# Patient Record
Sex: Male | Born: 1985 | Race: White | Hispanic: No | Marital: Married | Smoking: Current every day smoker
Health system: Southern US, Community
[De-identification: ages and names within clinical notes are randomized; demographics above are authoritative.]

## PROBLEM LIST (undated history)

## (undated) ENCOUNTER — Emergency Department (HOSPITAL_COMMUNITY): Admission: EM | Payer: Self-pay | Source: Home / Self Care

## (undated) DIAGNOSIS — S42409A Unspecified fracture of lower end of unspecified humerus, initial encounter for closed fracture: Secondary | ICD-10-CM

## (undated) DIAGNOSIS — L0390 Cellulitis, unspecified: Secondary | ICD-10-CM

---
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
## 2007-09-02 ENCOUNTER — Other Ambulatory Visit: Payer: Self-pay

## 2007-09-02 ENCOUNTER — Emergency Department: Payer: Self-pay | Admitting: Emergency Medicine

## 2007-10-09 ENCOUNTER — Emergency Department: Payer: Self-pay | Admitting: Emergency Medicine

## 2007-11-01 ENCOUNTER — Emergency Department: Payer: Self-pay | Admitting: Unknown Physician Specialty

## 2008-07-19 ENCOUNTER — Inpatient Hospital Stay: Payer: Self-pay | Admitting: Internal Medicine

## 2008-07-20 ENCOUNTER — Inpatient Hospital Stay: Payer: Self-pay | Admitting: Psychiatry

## 2009-11-04 ENCOUNTER — Emergency Department (HOSPITAL_COMMUNITY): Admission: EM | Admit: 2009-11-04 | Discharge: 2009-11-04 | Payer: Self-pay | Admitting: Emergency Medicine

## 2010-06-10 ENCOUNTER — Emergency Department (HOSPITAL_COMMUNITY)
Admission: EM | Admit: 2010-06-10 | Discharge: 2010-06-10 | Payer: Self-pay | Source: Home / Self Care | Admitting: Emergency Medicine

## 2010-10-18 ENCOUNTER — Emergency Department (HOSPITAL_COMMUNITY)
Admission: EM | Admit: 2010-10-18 | Discharge: 2010-10-19 | Disposition: A | Discharge status: Home / Self Care | Payer: No Typology Code available for payment source | Attending: Emergency Medicine | Admitting: Emergency Medicine

## 2010-10-18 DIAGNOSIS — G8929 Other chronic pain: Secondary | ICD-10-CM | POA: Insufficient documentation

## 2010-10-18 DIAGNOSIS — M546 Pain in thoracic spine: Secondary | ICD-10-CM | POA: Insufficient documentation

## 2011-06-25 DIAGNOSIS — S42409A Unspecified fracture of lower end of unspecified humerus, initial encounter for closed fracture: Secondary | ICD-10-CM

## 2011-06-25 HISTORY — DX: Unspecified fracture of lower end of unspecified humerus, initial encounter for closed fracture: S42.409A

## 2013-07-05 ENCOUNTER — Encounter (HOSPITAL_COMMUNITY): Payer: Self-pay | Admitting: Emergency Medicine

## 2013-07-05 ENCOUNTER — Emergency Department (HOSPITAL_COMMUNITY)
Admission: EM | Admit: 2013-07-05 | Discharge: 2013-07-05 | Disposition: A | Discharge status: Home / Self Care | Payer: Self-pay | Attending: Emergency Medicine | Admitting: Emergency Medicine

## 2013-07-05 ENCOUNTER — Emergency Department (HOSPITAL_COMMUNITY): Payer: Self-pay

## 2013-07-05 DIAGNOSIS — M1990 Unspecified osteoarthritis, unspecified site: Secondary | ICD-10-CM

## 2013-07-05 DIAGNOSIS — X500XXA Overexertion from strenuous movement or load, initial encounter: Secondary | ICD-10-CM | POA: Insufficient documentation

## 2013-07-05 DIAGNOSIS — S6990XA Unspecified injury of unspecified wrist, hand and finger(s), initial encounter: Principal | ICD-10-CM | POA: Insufficient documentation

## 2013-07-05 DIAGNOSIS — M19029 Primary osteoarthritis, unspecified elbow: Secondary | ICD-10-CM | POA: Insufficient documentation

## 2013-07-05 DIAGNOSIS — Y9389 Activity, other specified: Secondary | ICD-10-CM | POA: Insufficient documentation

## 2013-07-05 DIAGNOSIS — M25522 Pain in left elbow: Secondary | ICD-10-CM

## 2013-07-05 DIAGNOSIS — Z8781 Personal history of (healed) traumatic fracture: Secondary | ICD-10-CM | POA: Insufficient documentation

## 2013-07-05 DIAGNOSIS — S59909A Unspecified injury of unspecified elbow, initial encounter: Secondary | ICD-10-CM | POA: Insufficient documentation

## 2013-07-05 DIAGNOSIS — S59919A Unspecified injury of unspecified forearm, initial encounter: Principal | ICD-10-CM

## 2013-07-05 DIAGNOSIS — G8921 Chronic pain due to trauma: Secondary | ICD-10-CM | POA: Insufficient documentation

## 2013-07-05 DIAGNOSIS — Y929 Unspecified place or not applicable: Secondary | ICD-10-CM | POA: Insufficient documentation

## 2013-07-05 HISTORY — DX: Unspecified fracture of lower end of unspecified humerus, initial encounter for closed fracture: S42.409A

## 2013-07-05 MED ORDER — NAPROXEN 500 MG PO TABS: 500.0000 mg | ORAL_TABLET | Freq: Two times a day (BID) | ORAL | Status: DC

## 2013-07-05 NOTE — Discharge Instructions (Signed)
Ice, take naproxen as directed. Avoid heavy lifting or hard physical activity for the next few days.  Osteoarthritis Osteoarthritis is a disease that causes soreness and swelling (inflammation) of a joint. It occurs when the cartilage at the affected joint wears down. Cartilage acts as a cushion, covering the ends of bones where they meet to form a joint. Osteoarthritis is the most common form of arthritis. It often occurs in older people. The joints affected most often by this condition include those in the:  Ends of the fingers.  Thumbs.  Neck.  Lower back.  Knees.  Hips. CAUSES  Over time, the cartilage that covers the ends of bones begins to wear away. This causes bone to rub on bone, producing pain and stiffness in the affected joints.  RISK FACTORS Certain factors can increase your chances of having osteoarthritis, including:  Older age.  Excessive body weight.  Overuse of joints. SIGNS AND SYMPTOMS   Pain, swelling, and stiffness in the joint.  Over time, the joint may lose its normal shape.  Small deposits of bone (osteophytes) may grow on the edges of the joint.  Bits of bone or cartilage can break off and float inside the joint space. This may cause more pain and damage. DIAGNOSIS  Your health care provider will do a physical exam and ask about your symptoms. Various tests may be ordered, such as:  X-rays of the affected joint.  An MRI scan.  Blood tests to rule out other types of arthritis.  Joint fluid tests. This involves using a needle to draw fluid from the joint and examining the fluid under a microscope. TREATMENT  Goals of treatment are to control pain and improve joint function. Treatment plans may include:  A prescribed exercise program that allows for rest and joint relief.  A weight control plan.  Pain relief techniques, such as:  Properly applied heat and cold.  Electric pulses delivered to nerve endings under the skin (transcutaneous  electrical nerve stimulation, TENS).  Massage.  Certain nutritional supplements.  Medicines to control pain, such as:  Acetaminophen.  Nonsteroidal anti-inflammatory drugs (NSAIDs), such as naproxen.  Narcotic or central-acting agents, such as tramadol.  Corticosteroids. These can be given orally or as an injection.  Surgery to reposition the bones and relieve pain (osteotomy) or to remove loose pieces of bone and cartilage. Joint replacement may be needed in advanced states of osteoarthritis. HOME CARE INSTRUCTIONS   Only take over-the-counter or prescription medicines as directed by your health care provider. Take all medicines exactly as instructed.  Maintain a healthy weight. Follow your health care provider's instructions for weight control. This may include dietary instructions.  Exercise as directed. Your health care provider can recommend specific types of exercise. These may include:  Strengthening exercises These are done to strengthen the muscles that support joints affected by arthritis. They can be performed with weights or with exercise bands to add resistance.  Aerobic activities These are exercises, such as brisk walking or low-impact aerobics, that get your heart pumping.  Range-of-motion activities These keep your joints limber.  Balance and agility exercises These help you maintain daily living skills.  Rest your affected joints as directed by your health care provider.  Follow up with your health care provider as directed. SEEK MEDICAL CARE IF:   Your skin turns red.  You develop a rash in addition to your joint pain.  You have worsening joint pain. SEEK IMMEDIATE MEDICAL CARE IF:  You have a significant loss  of weight or appetite.  You have a fever along with joint or muscle aches.  You have night sweats. FOR MORE INFORMATION  National Institute of Arthritis and Musculoskeletal and Skin Diseases: www.niams.http://www.myers.net/nih.gov [employer]:  https://walker.com/www.nia.nih.gov American College of Rheumatology: www.rheumatology.org Document Released: 06/10/2005 Document Revised: 03/31/2013 Document Reviewed: 02/15/2013 [facility] Patient Information 2014 [employer], [place].
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
## 2013-07-05 NOTE — ED Notes (Signed)
Pt comfortable with d/c and f/u instructions. Prescriptions x1. Discussed RICE method for relieving elbow pain. Pt ambulatory from room in NAD.
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
## 2013-07-08 NOTE — ED Provider Notes (Signed)
Medical screening examination/treatment/procedure(s) were performed by non-physician practitioner and as supervising physician I was immediately available for consultation/collaboration.  EKG Interpretation   None        Laura-Lee Villegas R. Traves Majchrzak, MD 07/08/13 1607 

## 2015-09-07 IMAGING — CR DG ELBOW COMPLETE 3+V*L*
4 series · 4 of 4 positions shown · non-contrast
Comparison: 01/12/2011.

CLINICAL DATA: Pain lateral left elbow.  Pain with swelling.

EXAM:
LEFT ELBOW - COMPLETE 3+ VIEW

[x elbow ap left]
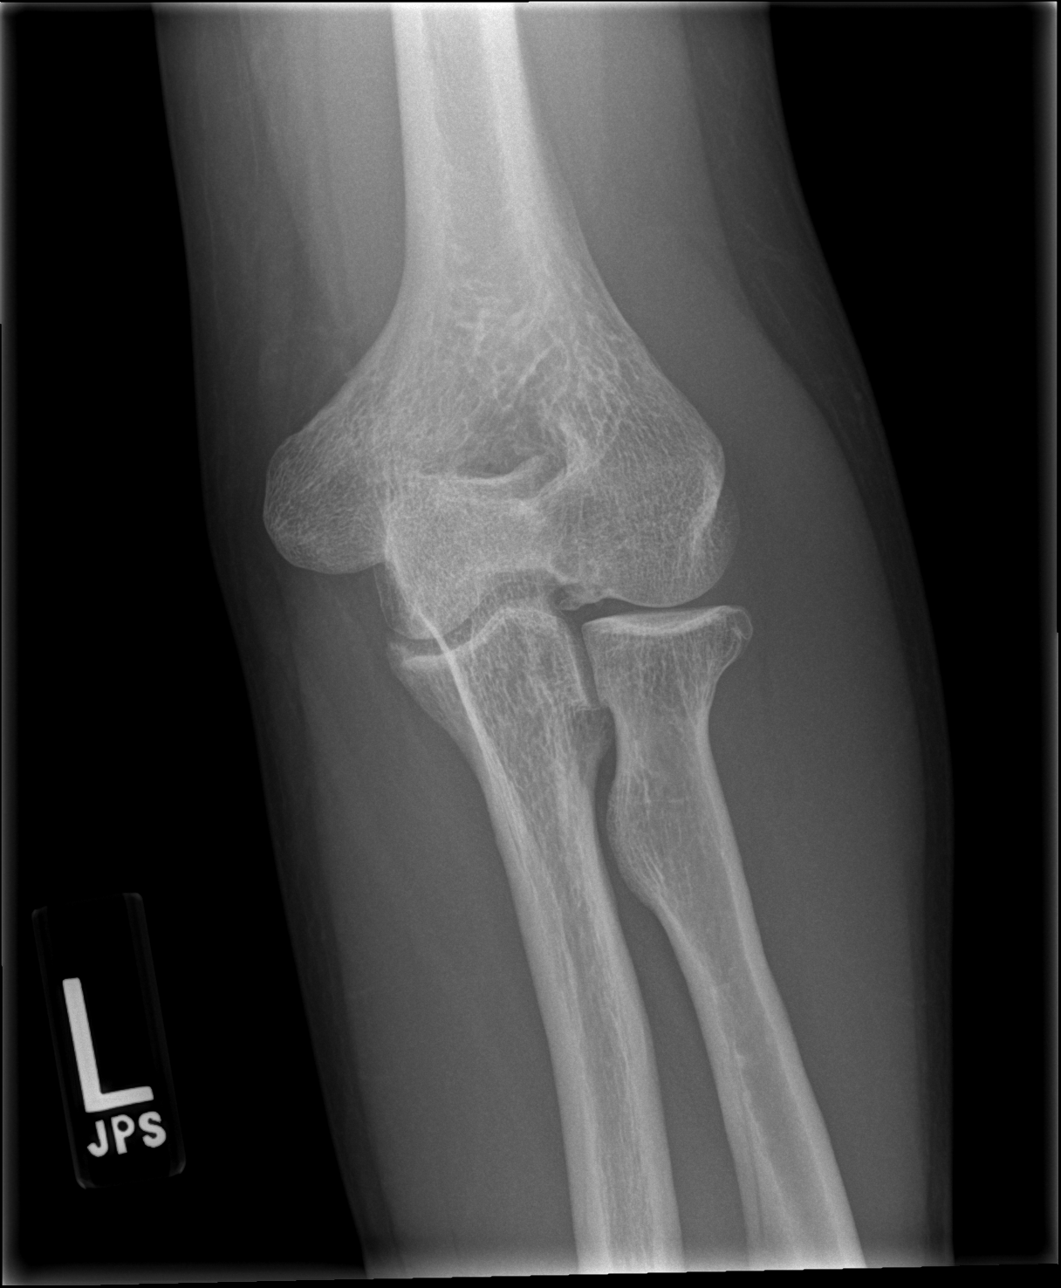

[x elbow obl left (1 of 2)]
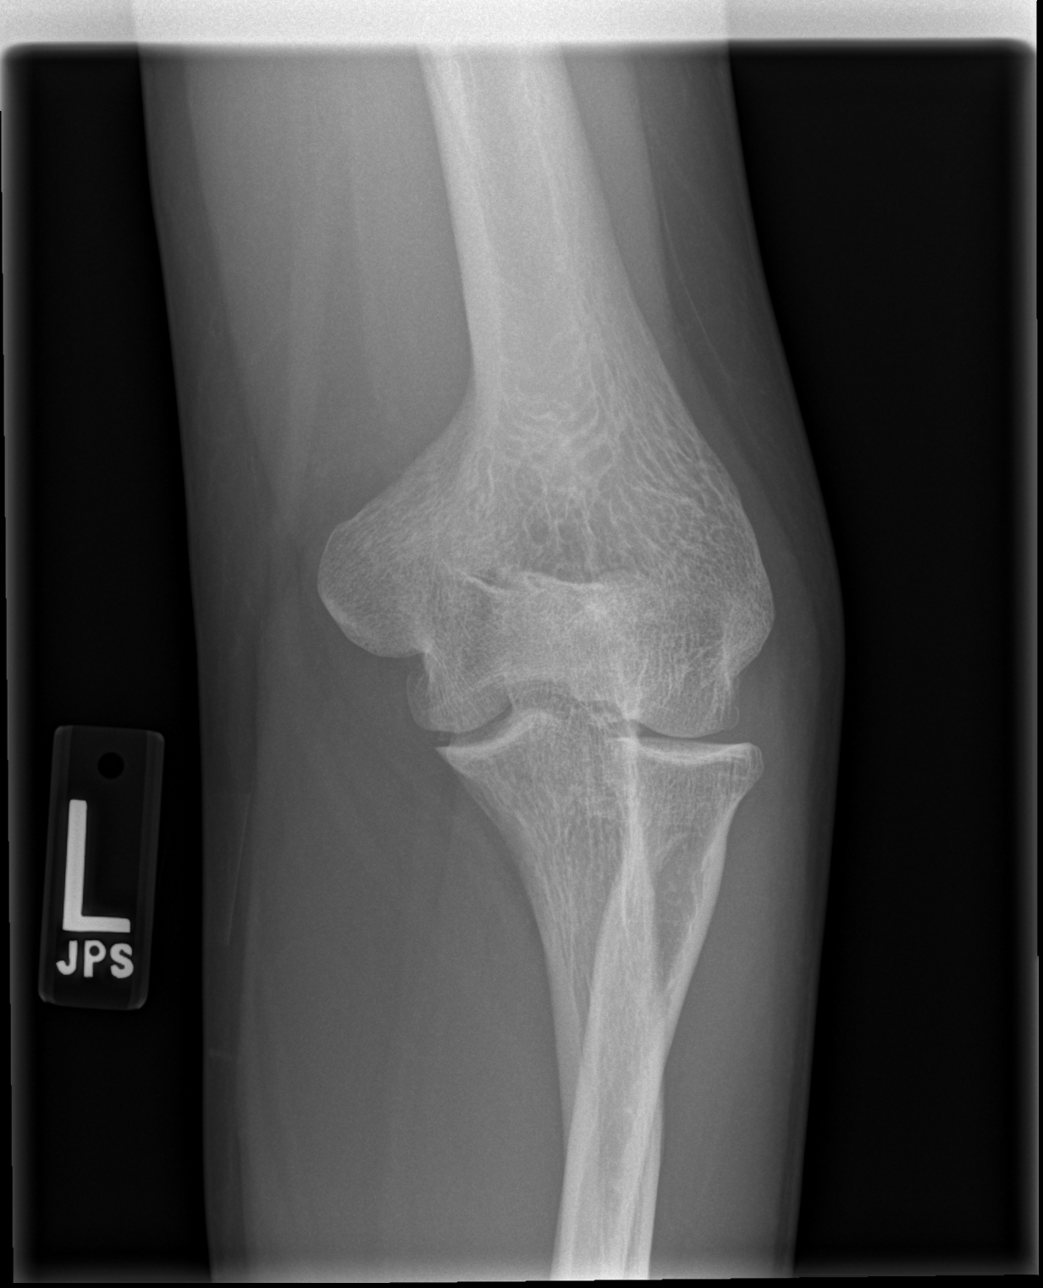

[x elbow obl left (2 of 2)]
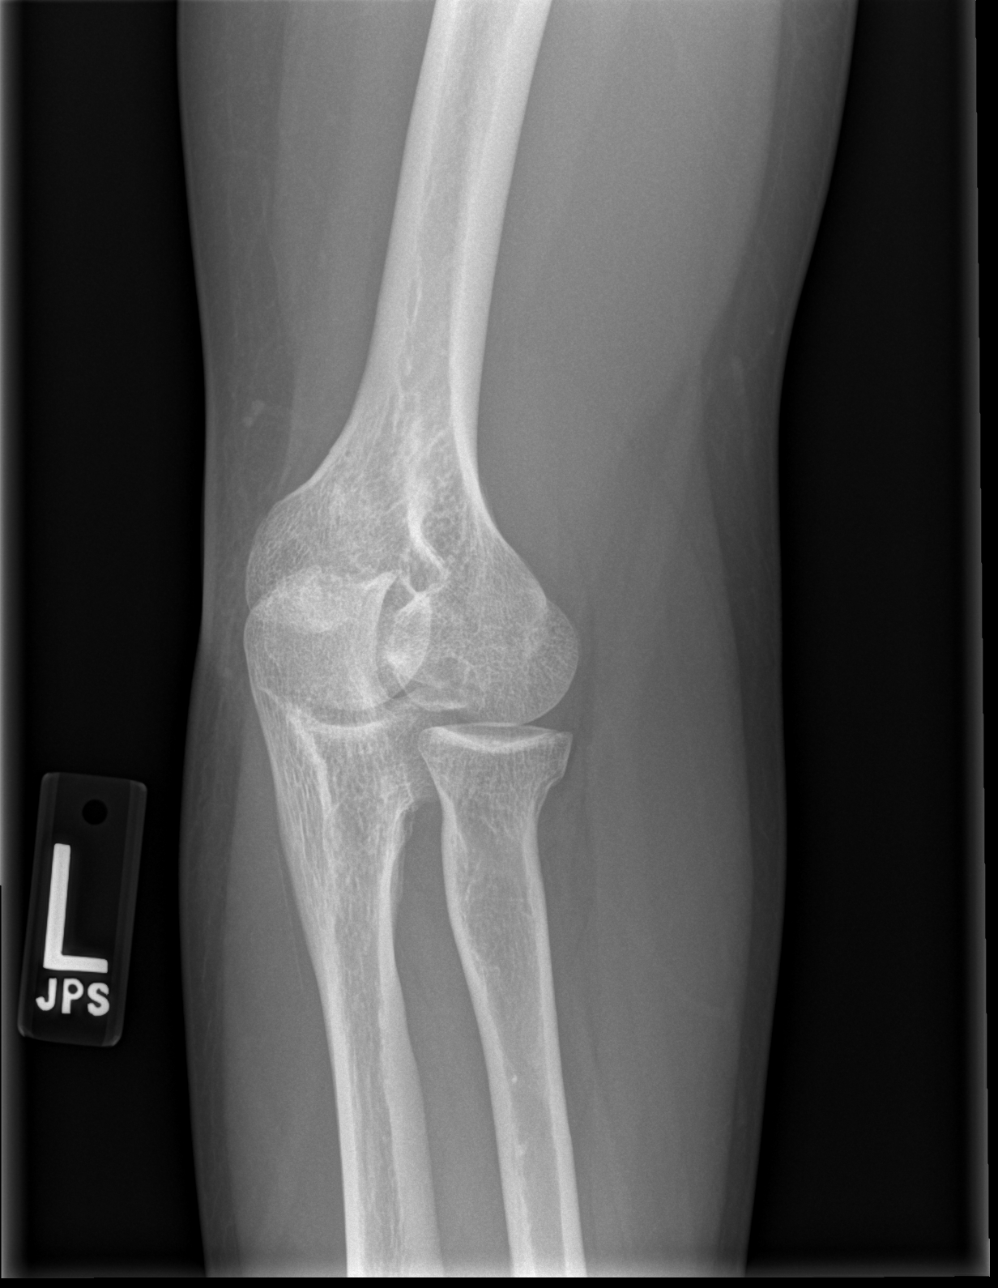

[x elbow lat left]
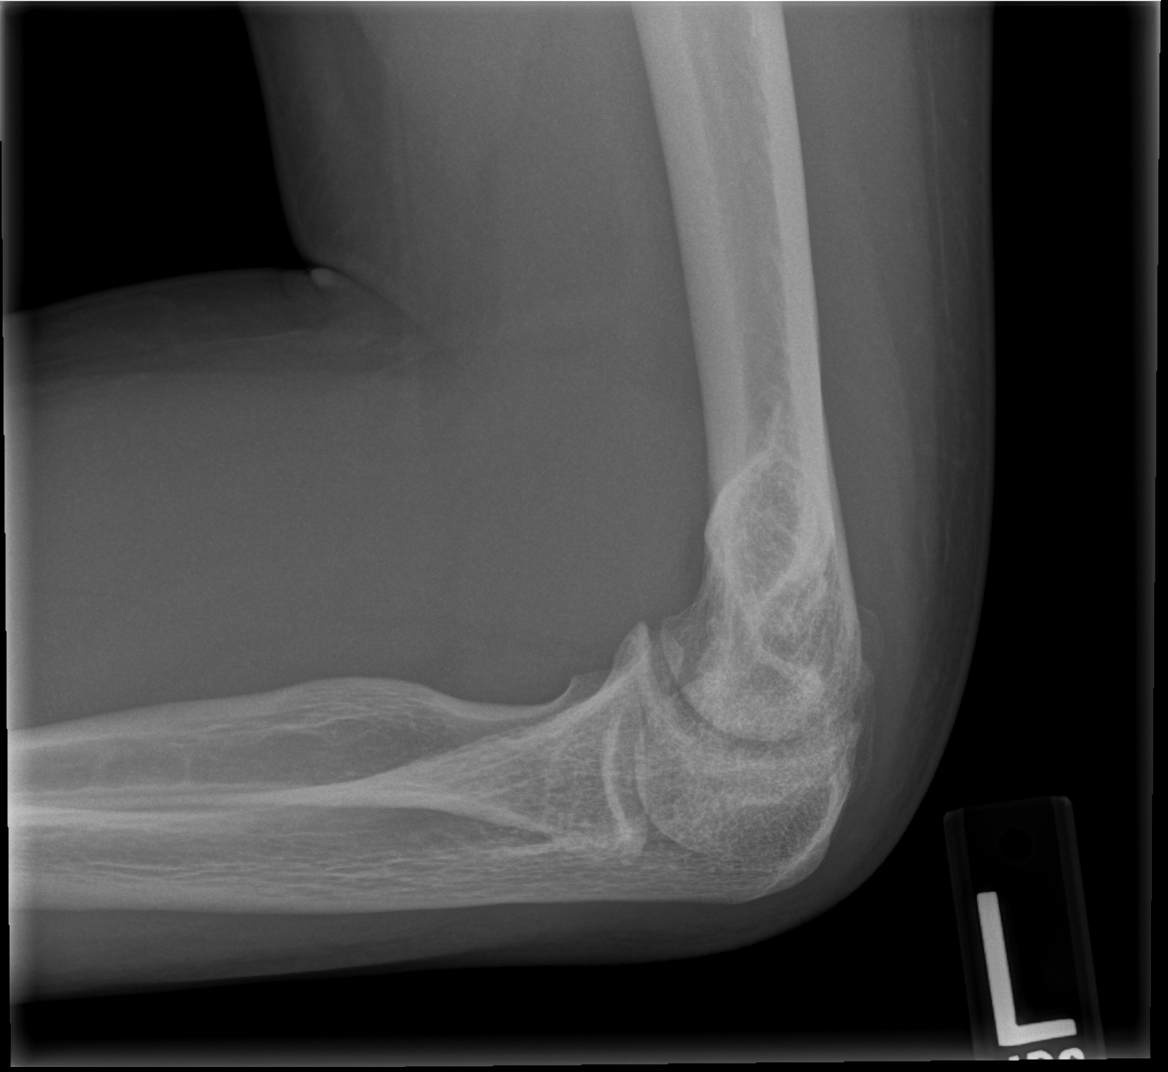

[4 of 4 positions shown; findings below may reference images not displayed]

FINDINGS: There is no elbow effusion. The alignment of the elbow is anatomic.
There is mild osteoarthritis of the elbow with marginal osteophytes
off the radial head. Radio capitellar joint space appears preserved.
Small spur is present off the lateral margin of the trochlea.
IMPRESSION: Elbow osteoarthritis.  No acute injury.

## 2016-07-28 ENCOUNTER — Emergency Department (HOSPITAL_COMMUNITY)
Admission: EM | Admit: 2016-07-28 | Discharge: 2016-07-28 | Disposition: A | Discharge status: Home / Self Care | Payer: 59 | Attending: Emergency Medicine | Admitting: Emergency Medicine

## 2016-07-28 ENCOUNTER — Encounter (HOSPITAL_COMMUNITY): Payer: Self-pay | Admitting: Emergency Medicine

## 2016-07-28 DIAGNOSIS — K047 Periapical abscess without sinus: Secondary | ICD-10-CM | POA: Insufficient documentation

## 2016-07-28 DIAGNOSIS — F17200 Nicotine dependence, unspecified, uncomplicated: Secondary | ICD-10-CM | POA: Diagnosis not present

## 2016-07-28 DIAGNOSIS — K0889 Other specified disorders of teeth and supporting structures: Secondary | ICD-10-CM | POA: Diagnosis present

## 2016-07-28 MED ORDER — OXYCODONE-ACETAMINOPHEN 5-325 MG PO TABS: 2.0000 | ORAL_TABLET | ORAL | 0 refills | Status: DC | PRN

## 2016-07-28 MED ORDER — CLINDAMYCIN HCL 150 MG PO CAPS: 450.0000 mg | ORAL_CAPSULE | Freq: Four times a day (QID) | ORAL | 0 refills | Status: AC

## 2016-07-28 NOTE — Discharge Instructions (Signed)
You have an abscess in your lower 3 teeth. You declined drainage today, but please be mindful that it is very important that you follow-up with a dentist as soon as possible for reevaluation and possible extraction. Tooth infection can be very dangerous as they can spread to you jaw. Please take antibiotics prescribed until completed. Take percocet for severe pain only, you may add 600 mg of ibuprofen and 650mg  of tylenol as well for additional pain control. Take percocet, tylenol and ibuprofen up to three times a day.   You may do salt water mouth washes.  You may slowly try to press on your gum line to get some of the discharge out.

## 2016-07-28 NOTE — ED Provider Notes (Signed)
[facility]-EMERGENCY DEPT Provider Note   CSN: [id number] Arrival date & time: 07/28/16  1129  By signing my name below, I, Sonum Patel, attest that this documentation has been prepared under the direction and in the presence of Sharen Hecklaudia Siddiq Kaluzny, PA-C. Electronically Signed: Sonum Patel, Neurosurgeoncribe. 07/28/16. 1:18 PM.  History   Chief Complaint Chief Complaint  Patient presents with  . Dental Problem    The history is provided by the patient. No language interpreter was used.     HPI Comments: Jose Turner is a 31 y.o. male who presents to the Emergency Department complaining of constant, front lower dental pain that began 3 days ago. He has not seen a dentist for this pain. He denies fever. No difficulty open/close jaw.  Past Medical History:  Diagnosis Date  . Fracture of elbow 2013    There are no active problems to display for this patient.   History reviewed. No pertinent surgical history.     Home Medications    Prior to Admission medications   Medication Sig Start Date End Date Taking? Authorizing Provider  clindamycin (CLEOCIN) 150 MG capsule Take 3 capsules (450 mg total) by mouth every 6 (six) hours. 07/28/16 08/04/16  Jose Handylaudia J Jolyssa Oplinger, PA-C  naproxen (NAPROSYN) 500 MG tablet Take 1 tablet (500 mg total) by mouth 2 (two) times daily. 07/05/13   Robyn M Hess, PA-C  OVER THE COUNTER MEDICATION Take 1 tablet by mouth once as needed (pain.). Pain reliever.    Historical Provider, MD  oxyCODONE-acetaminophen (PERCOCET/ROXICET) 5-325 MG tablet Take 2 tablets by mouth every 4 (four) hours as needed for severe pain. 07/28/16   Jose Handylaudia J Lasonia Casino, PA-C    Family History No family history on file.  Social History Social History  Substance Use Topics  . Smoking status: Current Every Day Smoker  . Smokeless tobacco: Current User  . Alcohol use No     Allergies   Patient has no known allergies.   Review of Systems Review of Systems  Constitutional: Negative for chills and fever.    HENT: Positive for dental problem. Negative for congestion and sore throat.   Eyes: Negative for visual disturbance.  Respiratory: Negative for cough, chest tightness and shortness of breath.   Cardiovascular: Negative for chest pain.  Gastrointestinal: Negative for abdominal pain, constipation, diarrhea, nausea and vomiting.  Genitourinary: Negative for decreased urine volume and difficulty urinating.  Musculoskeletal: Negative for arthralgias and joint swelling.  Skin: Negative for rash.  Neurological: Negative for dizziness, light-headedness and headaches.     Physical Exam Updated Vital Signs BP 119/66 (BP Location: Right Arm)   Pulse 61   Temp 98.3 F (36.8 C) (Oral)   Resp 17   Ht 6\' 4"  (1.93 m)   Wt 99.8 kg   SpO2 100%   BMI 26.78 kg/m   Physical Exam  Constitutional: He is oriented to person, place, and time. He appears well-developed and well-nourished. No distress.  NAD.  HENT:  Head: Normocephalic and atraumatic.  Right Ear: External ear normal.  Left Ear: External ear normal.  Nose: Nose normal.  Mouth/Throat: Oropharynx is clear and moist. No oropharyngeal exudate.  Fluctuance and purulent discharge along 3 incisors on the bottom.   Eyes: Conjunctivae and EOM are normal. Pupils are equal, round, and reactive to light. No scleral icterus.  Neck: Normal range of motion. Neck supple. No JVD present. No tracheal deviation present.  Cardiovascular: Normal rate, regular rhythm, normal heart sounds and intact distal pulses.   No  murmur heard. Pulmonary/Chest: Effort normal and breath sounds normal. He has no wheezes.  Abdominal: Soft. He exhibits no distension. There is no tenderness.  Musculoskeletal: Normal range of motion. He exhibits no deformity.  Lymphadenopathy:    He has no cervical adenopathy.  Neurological: He is alert and oriented to person, place, and time.  Skin: Skin is warm and dry. Capillary refill takes less than 2 seconds.  Psychiatric: He has a  normal mood and affect. His behavior is normal. Judgment and thought content normal.  Nursing note and vitals reviewed.    ED Treatments / Results  DIAGNOSTIC STUDIES: Oxygen Saturation is 100% on RA, normal by my interpretation.    COORDINATION OF CARE: 1:19 PM Discussed treatment plan with pt at bedside and pt agreed to plan.  Labs (all labs ordered are listed, but only abnormal results are displayed) Labs Reviewed - No data to display  EKG  EKG Interpretation None       Radiology No results found.  Procedures Procedures (including critical care time)  Medications Ordered in ED Medications - No data to display   Initial Impression / Assessment and Plan / ED Course  I have reviewed the triage vital signs and the nursing notes.  Pertinent labs & imaging results that were available during my care of the patient were reviewed by me and considered in my medical decision making (see chart for details).    31 year old male with history of tobacco abuse presents with dental pain along the bottom incisors onset 3 days ago. No fevers, no trismus, no facial edema. Last time patient saw dentist was 2 years ago. On exam there is purulent discharge, erythema, edema to the lower incisors. I attempted to drain purulent discharge however patient declined. Patient will be discharged with clindamycin and Percocet with urgent dental follow-up within 24-48 hours for reevaluation and possible extraction. Patient verbalized understanding, plans to call dentist tomorrow. Patient discussed with Dr. Clayborne Dana.  Final Clinical Impressions(s) / ED Diagnoses   Final diagnoses:  Dental abscess    New Prescriptions New Prescriptions   CLINDAMYCIN (CLEOCIN) 150 MG CAPSULE    Take 3 capsules (450 mg total) by mouth every 6 (six) hours.   OXYCODONE-ACETAMINOPHEN (PERCOCET/ROXICET) 5-325 MG TABLET    Take 2 tablets by mouth every 4 (four) hours as needed for severe pain.   I personally performed the  services described in this documentation, which was scribed in my presence. The recorded information has been reviewed and is accurate.    Jose Handy, PA-C 07/28/16 1410    Marily Memos, MD 07/28/16 [phone]

## 2016-07-28 NOTE — ED Notes (Signed)
Declined W/C at D/C and was escorted to lobby by RN. 

## 2016-09-03 ENCOUNTER — Emergency Department (HOSPITAL_COMMUNITY)
Admission: EM | Admit: 2016-09-03 | Discharge: 2016-09-04 | Disposition: A | Discharge status: Home / Self Care | Payer: 59 | Attending: Emergency Medicine | Admitting: Emergency Medicine

## 2016-09-03 ENCOUNTER — Encounter (HOSPITAL_COMMUNITY): Payer: Self-pay | Admitting: *Deleted

## 2016-09-03 ENCOUNTER — Emergency Department (HOSPITAL_COMMUNITY): Payer: 59

## 2016-09-03 DIAGNOSIS — Y9289 Other specified places as the place of occurrence of the external cause: Secondary | ICD-10-CM | POA: Diagnosis not present

## 2016-09-03 DIAGNOSIS — Y999 Unspecified external cause status: Secondary | ICD-10-CM | POA: Diagnosis not present

## 2016-09-03 DIAGNOSIS — Y9389 Activity, other specified: Secondary | ICD-10-CM | POA: Insufficient documentation

## 2016-09-03 DIAGNOSIS — F17200 Nicotine dependence, unspecified, uncomplicated: Secondary | ICD-10-CM | POA: Diagnosis not present

## 2016-09-03 DIAGNOSIS — G8929 Other chronic pain: Secondary | ICD-10-CM | POA: Insufficient documentation

## 2016-09-03 DIAGNOSIS — W1789XA Other fall from one level to another, initial encounter: Secondary | ICD-10-CM | POA: Diagnosis not present

## 2016-09-03 DIAGNOSIS — M25522 Pain in left elbow: Secondary | ICD-10-CM | POA: Diagnosis not present

## 2016-09-03 DIAGNOSIS — R109 Unspecified abdominal pain: Secondary | ICD-10-CM | POA: Diagnosis not present

## 2016-09-03 NOTE — ED Provider Notes (Signed)
[facility] Provider Note   CSN: [id number] Arrival date & time: 09/03/16  2229  By signing my name below, I, Majel HomerPeyton Lee, attest that this documentation has been prepared under the direction and in the presence of Terance HartKelly Keionna Kinnaird, PA-C . Electronically Signed: Majel HomerPeyton Lee, Scribe. 09/03/2016. 11:34 PM.  History   Chief Complaint Chief Complaint  Patient presents with  . Hip Pain   The history is provided by the patient. No language interpreter was used.   HPI Comments: Jose Turner is a 31 y.o. male with PMHx of osteoarthritis and left elbow fracture, who presents to the Emergency Department complaining of gradually worsening, left elbow pain s/p a fall that occurred ~1 week ago. Pt reports he was standing on top of a truck last week when he suddenly fell, striking the side of the truck, before landing on the ground. He states associated generalized left flank pain secondary to his fall that has gradually improved since the incident. He notes his left elbow pain is worse compared to his flank pain and is exacerbated with movement. Pt denies hematuria and shortness of breath.    Past Medical History:  Diagnosis Date  . Fracture of elbow 2013   There are no active problems to display for this patient.  History reviewed. No pertinent surgical history.  Home Medications    Prior to Admission medications   Medication Sig Start Date End Date Taking? Authorizing Provider  naproxen (NAPROSYN) 500 MG tablet Take 1 tablet (500 mg total) by mouth 2 (two) times daily. 07/05/13   Robyn M Hess, PA-C  OVER THE COUNTER MEDICATION Take 1 tablet by mouth once as needed (pain.). Pain reliever.    Historical Provider, MD  oxyCODONE-acetaminophen (PERCOCET/ROXICET) 5-325 MG tablet Take 2 tablets by mouth every 4 (four) hours as needed for severe pain. 07/28/16   Liberty Handylaudia J Gibbons, PA-C    Family History No family history on file.  Social History Social History  Substance Use Topics  . Smoking  status: Current Every Day Smoker  . Smokeless tobacco: Current User  . Alcohol use No   Allergies   Patient has no known allergies.  Review of Systems Review of Systems  Respiratory: Negative for shortness of breath.   Genitourinary: Negative for hematuria.  Musculoskeletal: Positive for arthralgias.   Physical Exam Updated Vital Signs BP 138/81   Pulse 71   Temp 98.2 F (36.8 C)   Resp 16   SpO2 100%   Physical Exam  Constitutional: He is oriented to person, place, and time. He appears well-developed and well-nourished.  HENT:  Head: Normocephalic.  Eyes: EOM are normal.  Neck: Normal range of motion.  Pulmonary/Chest: Effort normal.  Abdominal: He exhibits no distension. There is tenderness.  Left flank has no swelling, no bruising, no abrasions. Diffuse tenderness over the left flank.   Musculoskeletal: Normal range of motion. He exhibits tenderness. He exhibits no deformity.  Left elbow has no swelling, no deformity, full ROM. Tenderness over the lateral aspect and olecranon.   Neurological: He is alert and oriented to person, place, and time.  Psychiatric: He has a normal mood and affect.  Nursing note and vitals reviewed.  ED Treatments / Results  DIAGNOSTIC STUDIES:  Oxygen Saturation is 100% on RA, normal by my interpretation.    COORDINATION OF CARE:  11:30 PM Discussed treatment plan with pt at bedside and pt agreed to plan.  Labs (all labs ordered are listed, but only abnormal results are displayed) Labs Reviewed -  No data to display  EKG  EKG Interpretation None       Radiology No results found.  Procedures Procedures (including critical care time)  Medications Ordered in ED Medications - No data to display  Initial Impression / Assessment and Plan / ED Course  I have reviewed the triage vital signs and the nursing notes.  Pertinent labs & imaging results that were available during my care of the patient were reviewed by me and  considered in my medical decision making (see chart for details).  31 year old male with acute on chronic elbow pain and flank pain. Xray negative for acute change. I don't think imaging is indicated for his abdomen since he has no skin changes, bruising, swelling, hematuria. Will rx muscle relaxer and advised continue OTC pain medicine. Return precautions given.  I personally performed the services described in this documentation, which was scribed in my presence. The recorded information has been reviewed and is accurate.   Final Clinical Impressions(s) / ED Diagnoses   Final diagnoses:  Chronic elbow pain, left  Left flank pain    New Prescriptions New Prescriptions   No medications on file     Bethel Born, PA-C 09/06/16 1512    Geoffery Lyons, MD 09/06/16 [phone]

## 2016-09-03 NOTE — ED Triage Notes (Signed)
Pt c/o L hip and L elbow pain after falling off a truck last week. Reports difficulty sleeping due to pain.

## 2016-09-04 MED ORDER — METHOCARBAMOL 500 MG PO TABS: 500.0000 mg | ORAL_TABLET | Freq: Every evening | ORAL | 0 refills | Status: DC | PRN

## 2016-09-04 NOTE — ED Notes (Signed)
Pt verbalized understanding of d/c instructions and has no further questions. Pt stable and NAD.  

## 2016-09-04 NOTE — Discharge Instructions (Addendum)
Continue Ibuprofen for pain as needed Take muscle relaxer at bedtime to help you sleep Follow up with primary care doctor if pain continues

## 2016-09-10 ENCOUNTER — Encounter (HOSPITAL_COMMUNITY): Payer: Self-pay

## 2016-09-10 ENCOUNTER — Emergency Department (HOSPITAL_COMMUNITY)
Admission: EM | Admit: 2016-09-10 | Discharge: 2016-09-10 | Disposition: A | Discharge status: Home / Self Care | Payer: 59 | Attending: Emergency Medicine | Admitting: Emergency Medicine

## 2016-09-10 DIAGNOSIS — F17200 Nicotine dependence, unspecified, uncomplicated: Secondary | ICD-10-CM | POA: Insufficient documentation

## 2016-09-10 DIAGNOSIS — K0889 Other specified disorders of teeth and supporting structures: Secondary | ICD-10-CM

## 2016-09-10 DIAGNOSIS — K029 Dental caries, unspecified: Secondary | ICD-10-CM | POA: Insufficient documentation

## 2016-09-10 MED ORDER — PENICILLIN V POTASSIUM 500 MG PO TABS: 500.0000 mg | ORAL_TABLET | Freq: Four times a day (QID) | ORAL | 0 refills | Status: AC

## 2016-09-10 MED ORDER — OXYCODONE-ACETAMINOPHEN 5-325 MG PO TABS
2.0000 | ORAL_TABLET | Freq: Once | ORAL | Status: AC
  Administered 2016-09-10: 2 via ORAL
  Filled 2016-09-10: qty 2

## 2016-09-10 NOTE — ED Provider Notes (Signed)
[facility]-EMER[facility]Y DEPT Provider Note   CSN: [id number] Arrival date & time: 09/10/16  1329  By signing my name below, I, Rosario AdieWilliam Andrew Hiatt, attest that this documentation has been prepared under the direction and in the presence of Joycie PeekBenjamin Demya Scruggs, PA-C.  Electronically Signed: Rosario AdieWilliam Andrew Hiatt, ED Scribe. 09/10/16. 2:42 PM.  History   Chief Complaint Chief Complaint  Patient presents with  . Dental Pain   The history is provided by the patient. No language interpreter was used.    HPI Comments: Jose Turner is a 31 y.o. male with no pertinent PMHx, who presents to the Emergency Department complaining of persistent, gradually worsening, left-sided, upper dental pain with associated overlying facial swelling beginning two days ago. He rates his current pain as 10/10. No treatments for his pain were tried prior to coming into the ED. Pt's pain is exacerbated with chewing. They are not currently followed by a dental specialist. Pt denies fever, chills, shortness of breath, trouble swallowing, or any other associated symptoms.   Past Medical History:  Diagnosis Date  . Fracture of elbow 2013   There are no active problems to display for this patient.  History reviewed. No pertinent surgical history.  Home Medications    Prior to Admission medications   Medication Sig Start Date End Date Taking? Authorizing Provider  methocarbamol (ROBAXIN) 500 MG tablet Take 1 tablet (500 mg total) by mouth at bedtime and may repeat dose one time if needed. 09/04/16   Bethel BornKelly Marie Gekas, PA-C  naproxen (NAPROSYN) 500 MG tablet Take 1 tablet (500 mg total) by mouth 2 (two) times daily. 07/05/13   Robyn M Hess, PA-C  OVER THE COUNTER MEDICATION Take 1 tablet by mouth once as needed (pain.). Pain reliever.    Historical Provider, MD  oxyCODONE-acetaminophen (PERCOCET/ROXICET) 5-325 MG tablet Take 2 tablets by mouth every 4 (four) hours as needed for severe pain. 07/28/16   Liberty Handylaudia J Gibbons, PA-C  penicillin  v potassium (VEETID) 500 MG tablet Take 1 tablet (500 mg total) by mouth 4 (four) times daily. 09/10/16 09/20/16  Joycie PeekBenjamin Nieve Rojero, PA-C   Family History No family history on file.  Social History Social History  Substance Use Topics  . Smoking status: Current Every Day Smoker  . Smokeless tobacco: Current User  . Alcohol use No   Allergies   Patient has no known allergies.  Review of Systems Review of Systems A complete review of systems was obtained and all systems are negative except as noted in the HPI and PMH.   Physical Exam Updated Vital Signs BP 134/75 (BP Location: Left Arm)   Pulse 77   Temp 98.5 F (36.9 C) (Oral)   Resp 16   SpO2 100%   Physical Exam  Constitutional: He appears well-developed and well-nourished. No distress.  HENT:  Head: Normocephalic and atraumatic.  Discomfort located to left mandibular molars. Overall chronically poor dentition. Multiple missing teeth with active caries. Mucous membranes are moist. No unilateral tonsillar swelling, uvula midline, no glossal swelling or elevation. No trismus. No fluctuance or evidence of a drainable abscess. No other evidence of emergent infection, Retropharyngeal or Peritonsillar abscess, Ludwig or Vincents angina. Tolerating secretions well. Patent airway.  Eyes: Conjunctivae and EOM are normal.  Neck: Normal range of motion. Neck supple.  Cardiovascular: Normal rate.   Pulmonary/Chest: Effort normal. No respiratory distress.  Abdominal: Soft. He exhibits no distension.  Musculoskeletal: Normal range of motion.  Neurological: He is alert.  Skin: He is not diaphoretic. No pallor.  Psychiatric: He has a normal mood and affect. His behavior is normal.  Nursing note and vitals reviewed.  ED Treatments / Results  DIAGNOSTIC STUDIES: Oxygen Saturation is 100% on RA, normal by my interpretation.   COORDINATION OF CARE: 2:41 PM-Discussed next steps with pt. Pt verbalized understanding and is agreeable with  the plan.   Labs (all labs ordered are listed, but only abnormal results are displayed) Labs Reviewed - No data to display  EKG  EKG Interpretation None      Radiology No results found.  Procedures Procedures   Medications Ordered in ED Medications  oxyCODONE-acetaminophen (PERCOCET/ROXICET) 5-325 MG per tablet 2 tablet (2 tablets Oral Given 09/10/16 1458)    Initial Impression / Assessment and Plan / ED Course  I have reviewed the triage vital signs and the nursing notes.  Pertinent labs & imaging results that were available during my care of the patient were reviewed by me and considered in my medical decision making (see chart for details).     Patient with dentalgia. No abscess requiring immediate incision and drainage. Exam not concerning for Ludwig's angina or pharyngeal abscess. Will treat with PCN. Pt instructed to follow-up with dentist.  Discussed return precautions. Pt safe for discharge.  Final Clinical Impressions(s) / ED Diagnoses   Final diagnoses:  Dentalgia   New Prescriptions New Prescriptions   PENICILLIN V POTASSIUM (VEETID) 500 MG TABLET    Take 1 tablet (500 mg total) by mouth 4 (four) times daily.   I personally performed the services described in this documentation, which was scribed in my presence. The recorded information has been reviewed and is accurate.     Joycie Peek, PA-C 09/10/16 1511    Marily Memos, MD 09/10/16 [phone]

## 2016-09-10 NOTE — Discharge Instructions (Signed)
Your discomfort is likely due to a dental infection. You will be treated for this with antibiotics. Continue taking Tylenol and Motrin for your discomfort. It is important for you to follow-up with dentistry for definitive care. Use the attached resource is to help find a dentist. Return to ED for any new or worsening symptoms as we discussed  [facility] [employer]  [employer]  [facility]  [address]  [place], [place] [address]  Phone [phone]  The [employer] [facility] [place] exemplifies the Dental School?s vision to improve the health and quality of life of all [place] by Public house managercreating leaders with a passion to care for the underserved and by leading the nation in community-based, service learning oral health education. We are committed to offering comprehensive general dental services for adults, children and special needs patients in a safe, caring and professional setting.  Appointments: Our clinic is open Monday through Friday 8:00 a.m. until 5:00 p.m. The amount of time scheduled for an appointment depends on the patient?s specific needs. We ask that you keep your appointed time for care or provide 24-hour notice of all appointment changes. Parents or legal guardians must accompany minor children.  Payment for Services: Medicaid and other insurance plans are welcome. Payment for services is due when services are rendered and may be made by cash or credit card. If you have dental insurance, we will assist you with your claim submission.   Emergencies: Emergency services will be provided Monday through Friday on a walk-in basis. Please arrive early for emergency services. After hours emergency services will be provided for patients of record as required.  Services:  Comprehensive General Dentistry  Children?s Dentistry  Oral Surgery -  Extractions  Root Canals  Sealants and Tooth Colored Fillings  Crowns and HaematologistBridges  Dentures and Partial Dentures  Implant Services  Periodontal Services and Cleanings  Cosmetic Tooth Whitening  Digital Radiography  3-D/[facility]

## 2016-09-10 NOTE — ED Notes (Signed)
Left upper tooth pain and swelling  To jaw x 2 days , states has had to cut back on smoking

## 2016-09-10 NOTE — ED Triage Notes (Signed)
Patient complains of left upper broken tooth with pain and facial swelling x 2 days. NAD.

## 2016-11-22 DIAGNOSIS — L0390 Cellulitis, unspecified: Secondary | ICD-10-CM

## 2016-11-22 HISTORY — DX: Cellulitis, unspecified: L03.90

## 2016-12-14 ENCOUNTER — Inpatient Hospital Stay (HOSPITAL_COMMUNITY)
Admission: EM | Admit: 2016-12-14 | Discharge: 2016-12-17 | DRG: 603 | Discharge status: Against Medical Advice | Payer: 59 | Attending: Internal Medicine | Admitting: Internal Medicine

## 2016-12-14 ENCOUNTER — Encounter (HOSPITAL_COMMUNITY): Payer: Self-pay | Admitting: Emergency Medicine

## 2016-12-14 DIAGNOSIS — L0390 Cellulitis, unspecified: Secondary | ICD-10-CM | POA: Diagnosis present

## 2016-12-14 DIAGNOSIS — L0291 Cutaneous abscess, unspecified: Secondary | ICD-10-CM

## 2016-12-14 DIAGNOSIS — L03114 Cellulitis of left upper limb: Principal | ICD-10-CM | POA: Diagnosis present

## 2016-12-14 DIAGNOSIS — Z79899 Other long term (current) drug therapy: Secondary | ICD-10-CM

## 2016-12-14 DIAGNOSIS — L03119 Cellulitis of unspecified part of limb: Secondary | ICD-10-CM | POA: Diagnosis not present

## 2016-12-14 DIAGNOSIS — Z87891 Personal history of nicotine dependence: Secondary | ICD-10-CM

## 2016-12-14 HISTORY — DX: Cellulitis, unspecified: L03.90

## 2016-12-14 LAB — COMPREHENSIVE METABOLIC PANEL
ALBUMIN: 4.3 g/dL (ref 3.5–5.0)
ALK PHOS: 84 U/L (ref 38–126)
ALT: 69 U/L — ABNORMAL HIGH (ref 17–63)
ANION GAP: 9 (ref 5–15)
AST: 60 U/L — ABNORMAL HIGH (ref 15–41)
BILIRUBIN TOTAL: 1.3 mg/dL — AB (ref 0.3–1.2)
BUN: 7 mg/dL (ref 6–20)
CALCIUM: 9.1 mg/dL (ref 8.9–10.3)
CO2: 23 mmol/L (ref 22–32)
Chloride: 104 mmol/L (ref 101–111)
Creatinine, Ser: 0.81 mg/dL (ref 0.61–1.24)
GFR calc Af Amer: >60 mL/min (ref >60)
GLUCOSE: 91 mg/dL (ref 65–99)
Potassium: 3.8 mmol/L (ref 3.5–5.1)
Sodium: 136 mmol/L (ref 135–145)
TOTAL PROTEIN: 7.8 g/dL (ref 6.5–8.1)

## 2016-12-14 LAB — CBC
HEMATOCRIT: 45.8 % (ref 39.0–52.0)
HEMOGLOBIN: 15.3 g/dL (ref 13.0–17.0)
MCH: 32.6 pg (ref 26.0–34.0)
MCHC: 33.4 g/dL (ref 30.0–36.0)
MCV: 97.4 fL (ref 78.0–100.0)
Platelets: 160 10*3/uL (ref 150–400)
RBC: 4.7 MIL/uL (ref 4.22–5.81)
RDW: 13.2 % (ref 11.5–15.5)
WBC: 11.3 10*3/uL — ABNORMAL HIGH (ref 4.0–10.5)

## 2016-12-14 MED ORDER — VANCOMYCIN HCL 10 G IV SOLR
2000.0000 mg | Freq: Once | INTRAVENOUS | Status: AC
  Administered 2016-12-14: 2000 mg via INTRAVENOUS
  Filled 2016-12-14: qty 2000

## 2016-12-14 MED ORDER — OXYCODONE-ACETAMINOPHEN 5-325 MG PO TABS
1.0000 | ORAL_TABLET | Freq: Once | ORAL | Status: AC
  Administered 2016-12-14: 1 via ORAL
  Filled 2016-12-14: qty 1

## 2016-12-14 MED ORDER — VANCOMYCIN HCL IN DEXTROSE 1-5 GM/200ML-% IV SOLN: 1000.0000 mg | Freq: Once | INTRAVENOUS | Status: DC

## 2016-12-14 NOTE — ED Provider Notes (Signed)
[facility] Provider Note   CSN: [id number] Arrival date & time: 12/14/16  1829     History   Chief Complaint Chief Complaint  Patient presents with  . Abscess    HPI Jose Turner is a 31 y.o. male.  The history is provided by the patient and a friend. No language interpreter was used.  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 Fracture of elbow 2013    Patient Active Problem List   Diagnosis Date Noted  . Cellulitis 12/14/2016    History reviewed. No pertinent surgical history.     Home Medications    Prior to Admission medications   Not on File    Family History History reviewed. No pertinent family history.  Social History Social History  Substance Use Topics  . Smoking status: Current Every Day Smoker  . Smokeless tobacco: Current User  . Alcohol use No     Allergies   Patient has no known allergies.   Review of Systems Review of Systems  Constitutional: Negative for chills and fever.  HENT: Negative for ear pain and sore throat.   Eyes: Negative for pain and visual disturbance.  Respiratory: Negative for cough and shortness of breath.   Cardiovascular: Negative for chest pain and palpitations.  Gastrointestinal: Negative for abdominal pain and vomiting.  Genitourinary: Negative for dysuria and hematuria.  Musculoskeletal: Negative for arthralgias and back pain.  Skin: Positive for rash. Negative for color  change.       Positive for induration and swelling  Neurological: Negative for seizures and syncope.  All other systems reviewed and are negative.    Physical Exam Updated Vital Signs BP 117/66 (BP Location: Right Arm)   Pulse 71   Temp 98.9 F (37.2 C) (Oral)   Resp 18   Ht 6\' 3"  (1.905 m)   Wt 97.5 kg (215 lb)   SpO2 100%   BMI 26.87 kg/m   Physical Exam  Constitutional: He appears well-developed.  HENT:  Head: Normocephalic and atraumatic.  Eyes: Conjunctivae are normal.  Neck: Neck supple.  Cardiovascular: Normal rate and regular rhythm.   No murmur heard. Pulmonary/Chest: Effort normal and breath sounds normal. No respiratory distress.  Abdominal: Soft. There is no tenderness.  Musculoskeletal: He exhibits edema.  Large induration over L medial surface of proximal forearm with extending erythema to upper arm and mid forearm  Neurological: He is alert. No cranial nerve deficit. Coordination normal.  5/5 motor strength and intact sensation in all extremities. Intact bilateral finger-to-nose coordination  Skin: Skin is warm and dry. Rash noted.  Nursing note and vitals reviewed.    ED Treatments / Results  Labs (all labs ordered are listed, but only abnormal results are displayed) Labs Reviewed  CBC - Abnormal; Notable for the following:       Result Value   WBC 11.3 (*)    All other components within normal limits  COMPREHENSIVE  METABOLIC PANEL - Abnormal; Notable for the following:    AST 60 (*)    ALT 69 (*)    Total Bilirubin 1.3 (*)    All other components within normal limits  CULTURE, BLOOD (ROUTINE X 2)  CULTURE, BLOOD (ROUTINE X 2)  HIV ANTIBODY (ROUTINE TESTING)    EKG  EKG Interpretation None       Radiology No results found.  Procedures Procedures (including critical care time) Angiocath insertion Performed by: Hebert Soho  Consent: Verbal consent obtained. Risks and benefits: risks, benefits and alternatives were discussed Time  out: Immediately prior to procedure a "time out" was called to verify the correct patient, procedure, equipment, support staff and site/side marked as required.  Preparation: Patient was prepped and draped in the usual sterile fashion.  Vein Location: R forearm  Ultrasound Guided  Gauge: 18g  Normal blood return and flush without difficulty Patient tolerance: Patient tolerated the procedure well with no immediate complications.    Medications Ordered in ED Medications  sodium chloride flush (NS) 0.9 % injection 3 mL (3 mLs Intravenous Given 12/15/16 0045)  sodium chloride flush (NS) 0.9 % injection 3 mL (not administered)  0.9 %  sodium chloride infusion (not administered)  ketorolac (TORADOL) 30 MG/ML injection 30 mg (not administered)  vancomycin (VANCOCIN) IVPB 1000 mg/200 mL premix (not administered)  vancomycin (VANCOCIN) 2,000 mg in sodium chloride 0.9 % 500 mL IVPB (0 mg Intravenous Stopped 12/15/16 0015)  oxyCODONE-acetaminophen (PERCOCET/ROXICET) 5-325 MG per tablet 1 tablet (1 tablet Oral Given 12/14/16 2215)     Initial Impression / Assessment and Plan / ED Course  I have reviewed the triage vital signs and the nursing notes.  Pertinent labs & imaging results that were available during my care of the patient were reviewed by me and considered in my medical decision making (see chart for details).     31 year old male history of IV drug use who presents with left proximal forearm swelling, erythema, and pain concerning for abscess vs cellulitis.  He states onset of symptoms over the past 2 weeks.  He denies any recent IV drug use, stating he last used 8 months ago.  He denies fevers.  States erythema has spread over the past 2 weeks with increasing pain and swelling.  Afebrile, VSS. Large induration over L medial surface of proximal forearm with extending erythema to upper arm and mid forearm.  CBC showing WBC 11.3, Hgb 15.3.  CMP showing AST 60, ALT 69.  Bedside  ultrasound showing sliver of fluid pocket likely representing abscess, otherwise mostly cobblestoning c/w cellulits.  Patient declining further I&D this evening.  Vancomycin given.  Patient admitted for further management and evaluation.  Pt care d/w Dr. Fredderick Phenix  Final Clinical Impressions(s) / ED Diagnoses   Final diagnoses:  Cellulitis of left upper extremity  Abscess    New Prescriptions There are no discharge medications for this patient.    Hebert Soho, MD 12/15/16 1610    Rolan Bucco, MD 12/15/16 1351    Rolan Bucco, MD 12/15/16 1352

## 2016-12-14 NOTE — Progress Notes (Signed)
Pharmacy Antibiotic Note  Jose Turner is a 31 y.o. male admitted on 12/14/2016 with cellulitis.  Pharmacy has been consulted for Vancomycin dosing. 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   Plan: -Vancomycin 2000 mg IV x 1 already given in ED, then give 1000 mg IV q8h -Trend WBC, temp, renal function  -F/U infectious work-up -Drug levels as indicated   Height: 6\' 3"  (190.5 cm) Weight: 215 lb (97.5 kg) IBW/kg (Calculated) : 84.5  Temp (24hrs), Avg:98.9 F (37.2 C), Min:98.9 F (37.2 C), Max:98.9 F (37.2 C)   Recent Labs Lab 12/14/16 2155  WBC 11.3*  CREATININE 0.81    Estimated Creatinine Clearance: 159.4 mL/min (by C-G formula based on SCr of 0.81 mg/dL).    No Known Allergies   Jose Turner, Jose Turner 12/14/2016 11:57 PM

## 2016-12-14 NOTE — H&P (Signed)
History and Physical    Jose Turner:[id number] DOB: 05/09/1986 DOA: 12/14/2016  PCP: Patient, No PcDaisy Blossomp Per  Patient coming from: home  Chief Complaint:  Red, pain left forearm  HPI: Jose BlossomJohn Turner is a 31 y.o. male with medical history significant of IVDA sober for 8 months comes in with 4 days of worsening left elbow swelling and redness.  Area started off small and progressively gotten more and more red and swollen.  No drainage.  No fevers.  No recent absesses or abx use.  Pt referred for admission for cellullitis.   Review of Systems: As per HPI otherwise 10 point review of systems negative.   Past Medical History:  Diagnosis Date  . Fracture of elbow 2013    History reviewed. No pertinent surgical history.   reports that he has been smoking.  He uses smokeless tobacco. He reports that he does not drink alcohol or use drugs.  No Known Allergies  History reviewed. No pertinent family history. no premature CAD  Prior to Admission medications   Medication Sig Start Date End Date Taking? Authorizing Provider  methocarbamol (ROBAXIN) 500 MG tablet Take 1 tablet (500 mg total) by mouth at bedtime and may repeat dose one time if needed. 09/04/16   Bethel BornGekas, Kelly Marie, PA-C  naproxen (NAPROSYN) 500 MG tablet Take 1 tablet (500 mg total) by mouth 2 (two) times daily. 07/05/13   Hess, Nada Boozerobyn M, PA-C  OVER THE COUNTER MEDICATION Take 1 tablet by mouth once as needed (pain.). Pain reliever.    [provider]  oxyCODONE-acetaminophen (PERCOCET/ROXICET) 5-325 MG tablet Take 2 tablets by mouth every 4 (four) hours as needed for severe pain. 07/28/16   Liberty HandyGibbons, Claudia J, PA-C    Physical Exam: Vitals:   12/14/16 1833 12/14/16 1834  BP: 140/79   Pulse: 85   Resp: 18   Temp: 98.9 F (37.2 C)   TempSrc: Oral Oral  SpO2: 100%   Weight: 97.5 kg (215 lb)   Height: 6\' 3"  (1.905 m)     Constitutional: NAD, calm, comfortable Vitals:   12/14/16 1833 12/14/16 1834  BP: 140/79   Pulse: 85    Resp: 18   Temp: 98.9 F (37.2 C)   TempSrc: Oral Oral  SpO2: 100%   Weight: 97.5 kg (215 lb)   Height: 6\' 3"  (1.905 m)    Eyes: PERRL, lids and conjunctivae normal ENMT: Mucous membranes are moist. Posterior pharynx clear of any exudate or lesions.Normal dentition.  Neck: normal, supple, no masses, no thyromegaly Respiratory: clear to auscultation bilaterally, no wheezing, no crackles. Normal respiratory effort. No accessory muscle use.  Cardiovascular: Regular rate and rhythm, no murmurs / rubs / gallops. No extremity edema. 2+ pedal pulses. No carotid bruits.  Abdomen: no tenderness, no masses palpated. No hepatosplenomegaly. Bowel sounds positive.  Musculoskeletal: no clubbing / cyanosis. No joint deformity upper and lower extremities. Good ROM, no contractures. Normal muscle tone.  Skin: no rashes, lesions, ulcers. No induration Neurologic: CN 2-12 grossly intact. Sensation intact, DTR normal. Strength 5/5 in all 4.  Psychiatric: Normal judgment and insight. Alert and oriented x 3. Normal mood.    Labs on Admission: I have personally reviewed following labs and imaging studies  CBC:  Recent Labs Lab 12/14/16 2155  WBC 11.3*  HGB 15.3  HCT 45.8  MCV 97.4  PLT 160   Basic Metabolic Panel:  Recent Labs Lab 12/14/16 2155  NA 136  K 3.8  CL 104  CO2 23  GLUCOSE  91  BUN 7  CREATININE 0.81  CALCIUM 9.1   GFR: Estimated Creatinine Clearance: 159.4 mL/min (by C-G formula based on SCr of 0.81 mg/dL). Liver Function Tests:  Recent Labs Lab 12/14/16 2155  AST 60*  ALT 69*  ALKPHOS 84  BILITOT 1.3*  PROT 7.8  ALBUMIN 4.3    Radiological Exams on Admission: No results found.   Assessment/Plan 31 yo male with left forearm/arm cellulitis  Principal Problem:   Cellulitis-  No absess yet.  Place on iv vanc with pharm to dose.  Warm compresses.  Vitals all stable.  Denies ivda at this time.  Has received percocet in ED.  Try to minimize controlled substances  as treatment for pain, try to use non addictive substances first.     DVT prophylaxis:  scds Code Status:  full Family Communication:  none  Disposition Plan:  Per day team Consults called:  none Admission status:  observation   Cristella Stiver A MD [facility]  If 7PM-7AM, please contact night-coverage [email or web address] Password [facility]  12/14/2016, 10:53 PM

## 2016-12-14 NOTE — ED Notes (Signed)
Attempted IV for blood draw per MD Belfi. Unable to obtain. MD made aware. Outlined reddened area of pt left arm.

## 2016-12-14 NOTE — ED Triage Notes (Signed)
Pt to ER for evaluation of left medial antecubital abscess, states noticed a bump 2 weeks ago but in the last 2-3 days it has become very swollen and red, warm to touch. A/o x4. Denies IV drug abuse.

## 2016-12-15 DIAGNOSIS — L03114 Cellulitis of left upper limb: Secondary | ICD-10-CM | POA: Diagnosis not present

## 2016-12-15 LAB — RAPID URINE DRUG SCREEN, HOSP PERFORMED
Amphetamines: NOT DETECTED
BARBITURATES: NOT DETECTED
Benzodiazepines: NOT DETECTED
Cocaine: POSITIVE — AB
Opiates: NOT DETECTED
TETRAHYDROCANNABINOL: POSITIVE — AB

## 2016-12-15 LAB — SEDIMENTATION RATE: Sed Rate: 3 mm/hr (ref 0–16)

## 2016-12-15 LAB — HIV ANTIBODY (ROUTINE TESTING W REFLEX): HIV Screen 4th Generation wRfx: NONREACTIVE

## 2016-12-15 MED ORDER — SODIUM CHLORIDE 0.9% FLUSH
3.0000 mL | Freq: Two times a day (BID) | INTRAVENOUS | Status: DC
  Administered 2016-12-15 – 2016-12-16 (×5): 3 mL via INTRAVENOUS

## 2016-12-15 MED ORDER — KETOROLAC TROMETHAMINE 30 MG/ML IJ SOLN
30.0000 mg | Freq: Three times a day (TID) | INTRAMUSCULAR | Status: DC | PRN
  Administered 2016-12-15 – 2016-12-16 (×5): 30 mg via INTRAVENOUS
  Filled 2016-12-15 (×5): qty 1

## 2016-12-15 MED ORDER — VANCOMYCIN HCL IN DEXTROSE 1-5 GM/200ML-% IV SOLN
1000.0000 mg | Freq: Three times a day (TID) | INTRAVENOUS | Status: DC
  Administered 2016-12-15 – 2016-12-17 (×7): 1000 mg via INTRAVENOUS
  Filled 2016-12-15 (×8): qty 200

## 2016-12-15 MED ORDER — SODIUM CHLORIDE 0.9 % IV SOLN: 250.0000 mL | INTRAVENOUS | Status: DC | PRN

## 2016-12-15 MED ORDER — SODIUM CHLORIDE 0.9% FLUSH: 3.0000 mL | INTRAVENOUS | Status: DC | PRN

## 2016-12-15 NOTE — Progress Notes (Signed)
Patient ID: Jose Turner, male   DOB: 11/22/1985, 31 y.o.   MRN: [id number]                                                                PROGRESS NOTE                                                                                                                                                                                                             Patient Demographics:    Jose Turner, is a 31 y.o. male, DOB - 04/06/1986, MRN:[id number]  Admit date - 12/14/2016   Admitting Physician Rachal A David, MD  Outpatient Primary MD for the patient is Patient, No Pcp Per  LOS - 0  Outpatient Specialists:     Chief Complaint  Patient presents with  . Abscess       Brief Narrative    31 y.o. male with medical history significant of IVDA sober for 8 months comes in with 4 days of worsening left elbow swelling and redness.  Area started off small and progressively gotten more and more red and swollen.  No drainage.  No fevers.  No recent absesses or abx use.  Pt referred for admission for cellullitis.    Subjective:    Mike Aspinall today has been afebrile, pt states redness improving. Denies ivda.    No headache, No chest pain, No abdominal pain - No Nausea, No new weakness tingling or numbness, No Cough - SOB.    Assessment  & Plan :    Principal Problem:   Cellulitis   Cellulitis Blood culture x2 pending Cont vanco iv pharmacy to dose Rocephin 1gm iv qday Check esr I think the site where the cellulitis is stemming from is from injection Repeat cbc , cmp in am  IVDA Check urine drug screen   Code Status : FULL CODE  Family Communication  : w patient  Disposition Plan  : home  Barriers For Discharge :   Consults  :  none  Procedures  :   DVT Prophylaxis  :  Lovenox - SCDs   Lab Results  Component Value Date   PLT 160 12/14/2016    Antibiotics  :  vanco iv 6/23=>  Anti-infectives    Start     Dose/Rate Route Frequency   Ordered Stop   12/15/16 0600  vancomycin  (VANCOCIN) IVPB 1000 mg/200 mL premix     1,000 mg 200 mL/hr over 60 Minutes Intravenous Every 8 hours 12/15/16 0000     12/14/16 2200  vancomycin (VANCOCIN) 2,000 mg in sodium chloride 0.9 % 500 mL IVPB     2,000 mg 250 mL/hr over 120 Minutes Intravenous  Once 12/14/16 2139 12/15/16 0015   12/14/16 2145  vancomycin (VANCOCIN) IVPB 1000 mg/200 mL premix  Status:  Discontinued     1,000 mg 200 mL/hr over 60 Minutes Intravenous  Once 12/14/16 2137 12/14/16 2139        Objective:   Vitals:   12/14/16 1833 12/14/16 1834 12/15/16 0037 12/15/16 0646  BP: 140/79  117/66 (!) 108/59  Pulse: 85  71 74  Resp: _0 Temp: 98.9 F (37.2 C)  98.9 F (37.2 C) 98.9 F (37.2 C)  TempSrc: Oral Oral Oral Oral  SpO2: 100%  100% 100%  Weight: 97.5 kg (215 lb)     Height: 6' 3" (1.905 m)       Wt Readings from Last 3 Encounters:  12/14/16 97.5 kg (215 lb)  07/28/16 99.8 kg (220 lb)     Intake/Output Summary (Last 24 hours) at 12/15/16 0949 Last data filed at 12/15/16 0900  Gross per 24 hour  Intake              120 ml  Output                0 ml  Net              120 ml     Physical Exam  Awake Alert, Oriented X 3, No new F.N deficits, Normal affect [place].AT,PERRAL Supple Neck,No JVD, No cervical lymphadenopathy appriciated.  Symmetrical Chest wall movement, Good air movement bilaterally, CTAB RRR,No Gallops,Rubs or new Murmurs, No Parasternal Heave +ve B.Sounds, Abd Soft, No tenderness, No organomegaly appriciated, No rebound - guarding or rigidity. No Cyanosis, Clubbing or edema,   1cm rounded area on the medial aspect just below medial epicondule,  Left elbow Slightly firm.   Redness about 7 cm area surrounding No janeway, no osler, no splinter    Data Review:    CBC  Recent Labs Lab 12/14/16 2155  WBC 11.3*  HGB 15.3  HCT 45.8  PLT 160  MCV 97.4  MCH 32.6  MCHC 33.4  RDW 13.2    Chemistries   Recent Labs Lab 12/14/16 2155  NA 136  K 3.8  CL 104    CO2 23  GLUCOSE 91  BUN 7  CREATININE 0.81  CALCIUM 9.1  AST 60*  ALT 69*  ALKPHOS 84  BILITOT 1.3*   ------------------------------------------------------------------------------------------------------------------ No results for input(s): CHOL, HDL, LDLCALC, TRIG, CHOLHDL, LDLDIRECT in the last 72 hours.  No results found for: HGBA1C ------------------------------------------------------------------------------------------------------------------ No results for input(s): TSH, T4TOTAL, T3FREE, THYROIDAB in the last 72 hours.  Invalid input(s): FREET3 ------------------------------------------------------------------------------------------------------------------ No results for input(s): VITAMINB12, FOLATE, FERRITIN, TIBC, IRON, RETICCTPCT in the last 72 hours.  Coagulation profile No results for input(s): INR, PROTIME in the last 168 hours.  No results for input(s): DDIMER in the last 72 hours.  Cardiac Enzymes No results for input(s): CKMB, TROPONINI, MYOGLOBIN in the last 168 hours.  Invalid input(s): CK ------------------------------------------------------------------------------------------------------------------ No results found for: BNP  Inpatient Medications  Scheduled Meds: . sodium chloride flush  3 mL Intravenous Q12H   Continuous Infusions: . sodium chloride    .  vancomycin Stopped (12/15/16 0711)   PRN Meds:.sodium chloride, ketorolac, sodium chloride flush  Micro Results No results found for this or any previous visit (from the past 240 hour(s)).  Radiology Reports No results found.  Time Spent in minutes  30   Jani Gravel M.D on 12/15/2016 at 9:49 AM  Between 7am to 7pm - Pager - [phone]  After 7pm go to [email or web address] - password [facility]  [facility] -  Office  [phone]

## 2016-12-16 ENCOUNTER — Encounter (HOSPITAL_COMMUNITY): Payer: Self-pay | Admitting: General Practice

## 2016-12-16 DIAGNOSIS — L03114 Cellulitis of left upper limb: Principal | ICD-10-CM

## 2016-12-16 LAB — COMPREHENSIVE METABOLIC PANEL
ALK PHOS: 72 U/L (ref 38–126)
ALT: 52 U/L (ref 17–63)
AST: 49 U/L — AB (ref 15–41)
Albumin: 3.3 g/dL — ABNORMAL LOW (ref 3.5–5.0)
Anion gap: 8 (ref 5–15)
BILIRUBIN TOTAL: 1 mg/dL (ref 0.3–1.2)
BUN: 6 mg/dL (ref 6–20)
CALCIUM: 8.8 mg/dL — AB (ref 8.9–10.3)
CO2: 25 mmol/L (ref 22–32)
Chloride: 107 mmol/L (ref 101–111)
Creatinine, Ser: 0.71 mg/dL (ref 0.61–1.24)
GFR calc Af Amer: >60 mL/min (ref >60)
Glucose, Bld: 101 mg/dL — ABNORMAL HIGH (ref 65–99)
POTASSIUM: 4.1 mmol/L (ref 3.5–5.1)
Sodium: 140 mmol/L (ref 135–145)
TOTAL PROTEIN: 6.6 g/dL (ref 6.5–8.1)

## 2016-12-16 LAB — CBC
HEMATOCRIT: 45.4 % (ref 39.0–52.0)
Hemoglobin: 14.5 g/dL (ref 13.0–17.0)
MCH: 31.9 pg (ref 26.0–34.0)
MCHC: 31.9 g/dL (ref 30.0–36.0)
MCV: 99.8 fL (ref 78.0–100.0)
Platelets: 139 10*3/uL — ABNORMAL LOW (ref 150–400)
RBC: 4.55 MIL/uL (ref 4.22–5.81)
RDW: 13.5 % (ref 11.5–15.5)
WBC: 7.9 10*3/uL (ref 4.0–10.5)

## 2016-12-16 MED ORDER — PIPERACILLIN-TAZOBACTAM 3.375 G IVPB
3.3750 g | Freq: Three times a day (TID) | INTRAVENOUS | Status: DC
  Administered 2016-12-16 – 2016-12-17 (×3): 3.375 g via INTRAVENOUS
  Filled 2016-12-16 (×5): qty 50

## 2016-12-16 NOTE — Progress Notes (Signed)
Patients left arm boil opened up. Pink tinged pus present. RN cleansed and wrapped with ABD pad and kerlix. MD notified. Nursing will continue to monitor.

## 2016-12-16 NOTE — Progress Notes (Addendum)
Patient ID: Jose Turner, male   DOB: 10/25/1985, 31 y.o.   MRN: [id number]                                                                PROGRESS NOTE                                                                                                                                                                                                             Patient Demographics:    Jose Turner, is a 31 y.o. male, DOB - 06/21/1986, [id number]  Admit date - 12/14/2016   Admitting Physician Haydee Monicaachal A David, MD  Outpatient Primary MD for the patient is Patient, No Pcp Per  LOS - 0  Outpatient Specialists:     Chief Complaint  Patient presents with  . Abscess       Brief Narrative    31 y.o. male with medical history significant of IVDA sober for 8 months comes in with 4 days of worsening left elbow swelling and redness.  Area started off small and progressively gotten more and more red and swollen.  No drainage.  No fevers.  No recent absesses or abx use.  Pt referred for admission for cellullitis.    Subjective:    Jose Turner  feels his elbow is 100% better but it still losing   Assessment  & Plan :    Principal Problem:   Cellulitis   Cellulitis Of left elbow Blood culture x2 no growth so far, If negative will likely DC on oral antibiotics tomorrow Cont vanco iv  , Added Zosyn If does not improve will need CT imaging Afebrile, nontoxic  IVDA-UDS positive for cocaine and marijuana HIV nonreactive   Code Status : FULL CODE  Family Communication  : w patient  Disposition Plan  : home  Barriers For Discharge :   Consults  :  none  Procedures  :   DVT Prophylaxis  :  Lovenox - SCDs   Lab Results  Component Value Date   PLT 139 (L) 12/16/2016    Antibiotics  :  vanco iv 6/23=>  Anti-infectives    Start     Dose/Rate Route Frequency Ordered Stop   12/15/16 0600  vancomycin (VANCOCIN) IVPB 1000 mg/200 mL premix     1,000  mg 200 mL/hr over 60 Minutes Intravenous Every  8 hours 12/15/16 0000     12/14/16 2200  vancomycin (VANCOCIN) 2,000 mg in sodium chloride 0.9 % 500 mL IVPB     2,000 mg 250 mL/hr over 120 Minutes Intravenous  Once 12/14/16 2139 12/15/16 0015   12/14/16 2145  vancomycin (VANCOCIN) IVPB 1000 mg/200 mL premix  Status:  Discontinued     1,000 mg 200 mL/hr over 60 Minutes Intravenous  Once 12/14/16 2137 12/14/16 2139        Objective:   Vitals:   12/15/16 0646 12/15/16 1500 12/15/16 2025 12/16/16 0558  BP: (!) 108/59 (!) 113/56 (!) 115/53 (!) 108/58  Pulse: 74 (!) 54 68 (!) 56  Resp: 16 16 16 16   Temp: 98.9 F (37.2 C) 98.2 F (36.8 C) 98.6 F (37 C) 98.7 F (37.1 C)  TempSrc: Oral Oral Oral Oral  SpO2: 100% 100% 100% 99%  Weight:      Height:        Wt Readings from Last 3 Encounters:  12/14/16 97.5 kg (215 lb)  07/28/16 99.8 kg (220 lb)     Intake/Output Summary (Last 24 hours) at 12/16/16 0942 Last data filed at 12/16/16 0327  Gross per 24 hour  Intake              960 ml  Output              400 ml  Net              560 ml     Physical Exam  Awake Alert, Oriented X 3, No new F.N deficits, Normal affect [name],PERRAL Supple Neck,No JVD, No cervical lymphadenopathy appriciated.  Symmetrical Chest wall movement, Good air movement bilaterally, CTAB RRR,No Gallops,Rubs or new Murmurs, No Parasternal Heave +ve B.Sounds, Abd Soft, No tenderness, No organomegaly appriciated, No rebound - guarding or rigidity. No Cyanosis, Clubbing or edema,   1cm rounded area on the medial aspect just below medial epicondule,  Left elbow Slightly firm.  No range of motion loss No janeway, no osler, no splinter    Data Review:    CBC  Recent Labs Lab 12/14/16 2155 12/16/16 0406  WBC 11.3* 7.9  HGB 15.3 14.5  HCT 45.8 45.4  PLT 160 139*  MCV 97.4 99.8  MCH 32.6 31.9  MCHC 33.4 31.9  RDW 13.2 13.5    Chemistries   Recent Labs Lab 12/14/16 2155 12/16/16 0406  NA 136 140  K 3.8 4.1  CL 104 107  CO2 23 25    GLUCOSE 91 101*  BUN 7 6  CREATININE 0.81 0.71  CALCIUM 9.1 8.8*  AST 60* 49*  ALT 69* 52  ALKPHOS 84 72  BILITOT 1.3* 1.0   ------------------------------------------------------------------------------------------------------------------ No results for input(s): CHOL, HDL, LDLCALC, TRIG, CHOLHDL, LDLDIRECT in the last 72 hours.  No results found for: HGBA1C ------------------------------------------------------------------------------------------------------------------ No results for input(s): TSH, T4TOTAL, T3FREE, THYROIDAB in the last 72 hours.  Invalid input(s): FREET3 ------------------------------------------------------------------------------------------------------------------ No results for input(s): VITAMINB12, FOLATE, FERRITIN, TIBC, IRON, RETICCTPCT in the last 72 hours.  Coagulation profile No results for input(s): INR, PROTIME in the last 168 hours.  No results for input(s): DDIMER in the last 72 hours.  Cardiac Enzymes No results for input(s): CKMB, TROPONINI, MYOGLOBIN in the last 168 hours.  Invalid input(s): CK ------------------------------------------------------------------------------------------------------------------ No results found for: BNP  Inpatient Medications  Scheduled Meds: . sodium chloride flush  3 mL Intravenous Q12H   Continuous Infusions: .  sodium chloride    . vancomycin Stopped (12/16/16 1610)   PRN Meds:.sodium chloride, ketorolac, sodium chloride flush  Micro Results Recent Results (from the past 240 hour(s))  Blood culture (routine x 2)     Status: None (Preliminary result)   Collection Time: 12/14/16  9:45 PM  Result Value Ref Range Status   Specimen Description BLOOD RIGHT HAND  Final   Special Requests   Final    BOTTLES DRAWN AEROBIC AND ANAEROBIC Blood Culture adequate volume   Culture NO GROWTH < 12 HOURS  Final   Report Status PENDING  Incomplete  Blood culture (routine x 2)     Status: None (Preliminary  result)   Collection Time: 12/14/16  9:45 PM  Result Value Ref Range Status   Specimen Description BLOOD RIGHT ANTECUBITAL  Final   Special Requests   Final    BOTTLES DRAWN AEROBIC ONLY Blood Culture adequate volume   Culture NO GROWTH < 12 HOURS  Final   Report Status PENDING  Incomplete    Radiology Reports No results found.  Time Spent in minutes  30   Richarda Overlie M.D on 12/16/2016 at 9:42 AM  Between 7am to 7pm - Pager    After 7pm go to [email or web address] - password [facility]  [facility] -  Office  [phone]

## 2016-12-16 NOTE — Progress Notes (Signed)
Pharmacy Antibiotic Note  Jose Turner is a 31 y.o. male admitted on 12/14/2016 with cellulitis and Pharmacy has been consulted for vancomycin dosing.  Patient's left arm boil drained pink-tinged pus so Pharmacy consulted to add Zosyn.    Patient's renal function remains stable.  He is afebrile and his WBC is WNL.   Plan: - Zosyn 3.375gm IV Q8H, 4 hr infusion - Monitor renal fxn, clinical progress, micro data   Height: 6\' 3"  (190.5 cm) Weight: 215 lb (97.5 kg) IBW/kg (Calculated) : 84.5  Temp (24hrs), Avg:98.5 F (36.9 C), Min:98.2 F (36.8 C), Max:98.7 F (37.1 C)   Recent Labs Lab 12/14/16 2155 12/16/16 0406  WBC 11.3* 7.9  CREATININE 0.81 0.71    Estimated Creatinine Clearance: 161.4 mL/min (by C-G formula based on SCr of 0.71 mg/dL).    No Known Allergies   Vanc 6/23 >>  Zosyn 6/25 >>  6/23 BCx - NGTD   Decklyn Hornik D. Laney Potashang, PharmD, BCPS Pager:  [phone] 12/16/2016, 10:45 AM

## 2016-12-17 DIAGNOSIS — L03114 Cellulitis of left upper limb: Secondary | ICD-10-CM | POA: Diagnosis not present

## 2016-12-17 MED ORDER — ACETAMINOPHEN ER 650 MG PO TBCR: 650.0000 mg | EXTENDED_RELEASE_TABLET | Freq: Three times a day (TID) | ORAL | 0 refills | Status: AC | PRN

## 2016-12-17 MED ORDER — DOXYCYCLINE HYCLATE 50 MG PO CAPS: 100.0000 mg | ORAL_CAPSULE | Freq: Two times a day (BID) | ORAL | 0 refills | Status: AC

## 2016-12-17 MED ORDER — AMOXICILLIN-POT CLAVULANATE 875-125 MG PO TABS: 1.0000 | ORAL_TABLET | Freq: Two times a day (BID) | ORAL | 0 refills | Status: AC

## 2016-12-17 NOTE — Progress Notes (Addendum)
Paged Dr. Susie CassetteAbrol regarding patient noted not being in room since 0830 this morning. Pt did not make nurse or any other staff member aware if he was going outside, etc.  Security looked for patient around campus with no success. Pt's sister (primary contact) also called and she had not heard from patient either and does not know his where abouts.  Pt has No IV access.

## 2016-12-17 NOTE — Progress Notes (Signed)
Patient refused morning lab draw. Also patient IV  became infiltrated and was discontinued. Patient is refusing a new IV placement. Patient states he is leaving today and will not complete any further IV antibiotics because he has to return to work. MD made aware. Nursing will continue to monitor.

## 2016-12-17 NOTE — Discharge Summary (Addendum)
Physician Discharge Summary  Jose Turner MRN: [id number] DOB/AGE: 1986-01-13 31 y.o.  PCP: Patient, No Pcp Per   Admit date: 12/14/2016 Discharge date: 12/17/2016  Discharge Diagnoses:    Principal Problem:   Cellulitis IVDrug  use    ADDENDUM Patient left AGAINST MEDICAL ADVICE, left off the floor without informing RN and never came back    Follow-up recommendations Follow-up with PCP in 3-5 days , including all  additional recommended appointments as below Follow-up CBC, CMP in 3-5 days       Current Discharge Medication List    START taking these medications   Details  acetaminophen (TYLENOL) 650 MG CR tablet Take 1 tablet (650 mg total) by mouth every 8 (eight) hours as needed for pain. Qty: 30 tablet, Refills: 0    amoxicillin-clavulanate (AUGMENTIN) 875-125 MG tablet Take 1 tablet by mouth 2 (two) times daily. Qty: 14 tablet, Refills: 0    doxycycline (VIBRAMYCIN) 50 MG capsule Take 2 capsules (100 mg total) by mouth 2 (two) times daily. Qty: 40 capsule, Refills: 0         Discharge Condition: stable   Discharge Instructions Get Medicines reviewed and adjusted: Please take all your medications with you for your next visit with your Primary MD  Please request your Primary MD to go over all hospital tests and procedure/radiological results at the follow up, please ask your Primary MD to get all Hospital records sent to his/her office.  If you experience worsening of your admission symptoms, develop shortness of breath, life threatening emergency, suicidal or homicidal thoughts you must seek medical attention immediately by calling 911 or calling your MD immediately if symptoms less severe.  You must read complete instructions/literature along with all the possible adverse reactions/side effects for all the Medicines you take and that have been prescribed to you. Take any new Medicines after you have completely understood and accpet all the possible adverse  reactions/side effects.   Do not drive when taking Pain medications.   Do not take more than prescribed Pain, Sleep and Anxiety Medications  Special Instructions: If you have smoked or chewed Tobacco in the last 2 yrs please stop smoking, stop any regular Alcohol and or any Recreational drug use.  Wear Seat belts while driving.  Please note  You were cared for by a hospitalist during your hospital stay. Once you are discharged, your primary care physician will handle any further medical issues. Please note that NO REFILLS for any discharge medications will be authorized once you are discharged, as it is imperative that you return to your primary care physician (or establish a relationship with a primary care physician if you do not have one) for your aftercare needs so that they can reassess your need for medications and monitor your lab values.     No Known Allergies    Disposition: 01-Home or Self Care   Consults:  None        Filed Weights   12/14/16 1833  Weight: 97.5 kg (215 lb)     Microbiology: Recent Results (from the past 240 hour(s))  Blood culture (routine x 2)     Status: None (Preliminary result)   Collection Time: 12/14/16  9:45 PM  Result Value Ref Range Status   Specimen Description BLOOD RIGHT HAND  Final   Special Requests   Final    BOTTLES DRAWN AEROBIC AND ANAEROBIC Blood Culture adequate volume   Culture NO GROWTH 2 DAYS  Final   Report Status PENDING  Incomplete  Blood culture (routine x 2)     Status: None (Preliminary result)   Collection Time: 12/14/16  9:45 PM  Result Value Ref Range Status   Specimen Description BLOOD RIGHT ANTECUBITAL  Final   Special Requests   Final    BOTTLES DRAWN AEROBIC ONLY Blood Culture adequate volume   Culture NO GROWTH 2 DAYS  Final   Report Status PENDING  Incomplete       Blood Culture    Component Value Date/Time   SDES BLOOD RIGHT HAND 12/14/2016 2145   SDES BLOOD RIGHT ANTECUBITAL 12/14/2016  2145   SPECREQUEST  12/14/2016 2145    BOTTLES DRAWN AEROBIC AND ANAEROBIC Blood Culture adequate volume   SPECREQUEST  12/14/2016 2145    BOTTLES DRAWN AEROBIC ONLY Blood Culture adequate volume   CULT NO GROWTH 2 DAYS 12/14/2016 2145   CULT NO GROWTH 2 DAYS 12/14/2016 2145   REPTSTATUS PENDING 12/14/2016 2145   REPTSTATUS PENDING 12/14/2016 2145      Labs: Results for orders placed or performed during the hospital encounter of 12/14/16 (from the past 48 hour(s))  Urine rapid drug screen (hosp performed)     Status: Abnormal   Collection Time: 12/15/16  2:09 PM  Result Value Ref Range   Opiates NONE DETECTED NONE DETECTED   Cocaine POSITIVE (A) NONE DETECTED   Benzodiazepines NONE DETECTED NONE DETECTED   Amphetamines NONE DETECTED NONE DETECTED   Tetrahydrocannabinol POSITIVE (A) NONE DETECTED   Barbiturates NONE DETECTED NONE DETECTED    Comment:        DRUG SCREEN FOR MEDICAL PURPOSES ONLY.  IF CONFIRMATION IS NEEDED FOR ANY PURPOSE, NOTIFY LAB WITHIN 5 DAYS.        LOWEST DETECTABLE LIMITS FOR URINE DRUG SCREEN Drug Class       Cutoff (ng/mL) Amphetamine      1000 Barbiturate      200 Benzodiazepine   494 Tricyclics       496 Opiates          300 Cocaine          300 THC              50   CBC     Status: Abnormal   Collection Time: 12/16/16  4:06 AM  Result Value Ref Range   WBC 7.9 4.0 - 10.5 K/uL   RBC 4.55 4.22 - 5.81 MIL/uL   Hemoglobin 14.5 13.0 - 17.0 g/dL   HCT 45.4 39.0 - 52.0 %   MCV 99.8 78.0 - 100.0 fL   MCH 31.9 26.0 - 34.0 pg   MCHC 31.9 30.0 - 36.0 g/dL   RDW 13.5 11.5 - 15.5 %   Platelets 139 (L) 150 - 400 K/uL  Comprehensive metabolic panel     Status: Abnormal   Collection Time: 12/16/16  4:06 AM  Result Value Ref Range   Sodium 140 135 - 145 mmol/L   Potassium 4.1 3.5 - 5.1 mmol/L   Chloride 107 101 - 111 mmol/L   CO2 25 22 - 32 mmol/L   Glucose, Bld 101 (H) 65 - 99 mg/dL   BUN 6 6 - 20 mg/dL   Creatinine, Ser 0.71 0.61 - 1.24 mg/dL    Calcium 8.8 (L) 8.9 - 10.3 mg/dL   Total Protein 6.6 6.5 - 8.1 g/dL   Albumin 3.3 (L) 3.5 - 5.0 g/dL   AST 49 (H) 15 - 41 U/L   ALT 52 17 - 63 U/L  Alkaline Phosphatase 72 38 - 126 U/L   Total Bilirubin 1.0 0.3 - 1.2 mg/dL   GFR calc non Af Amer >60 >60 mL/min   GFR calc Af Amer >60 >60 mL/min    Comment: (NOTE) The eGFR has been calculated using the CKD EPI equation. This calculation has not been validated in all clinical situations. eGFR's persistently <60 mL/min signify possible Chronic Kidney Disease.    Anion gap 8 5 - 15        Lab Results  Component Value Date   CREATININE 0.71 12/16/2016     HPI :*   31 year old male history of IV drug use who presents with left proximal forearm swelling, erythema, and pain concerning for abscess vs cellulitis.  He states onset of symptoms over the past 2 weeks.  He denies any recent IV drug use, stating he last used 8 months ago.  He denies fevers.  States erythema has spread over the past 2 weeks with increasing pain and swelling.Bedside ultrasound showing sliver of fluid pocket likely representing abscess, otherwise mostly cobblestoning c/w cellulits.  Patient declining further I&D this evening.  Vancomycin given.  Patient admitted for further management and evaluation  HOSPITAL COURSE:   Cellulitis Of left elbow Blood culture x2 no growth so far, Patient will be discharged on Augmentin and doxycycline Patient treated with vancomycin and Zosyn Patient declining INT in the ED, declined CT imaging Afebrile, nontoxic  IVDA-UDS positive for cocaine and marijuana HIV nonreactive   Discharge Exam:   Blood pressure 116/76, pulse 74, temperature 98 F (36.7 C), temperature source Oral, resp. rate 16, height 6' 3" (1.905 m), weight 97.5 kg (215 lb), SpO2 100 %.  Cardiovascular: Normal rate and regular rhythm.   No murmur heard. Pulmonary/Chest: Effort normal and breath sounds normal. No respiratory distress.  Abdominal: Soft.  There is no tenderness.  Musculoskeletal: He exhibits edema.  Large induration over L medial surface of proximal forearm with extending erythema to upper arm and mid forearm  Neurological: He is alert. No cranial nerve deficit. Coordination normal.  Skin: Skin is warm and dry. Rash noted.     Follow-up Information    pcp. Call.   Why:  in 3-5 days ,hospital follow up          Signed: Reyne Dumas 12/17/2016, 9:53 AM        Time spent >1 hour

## 2016-12-19 LAB — CULTURE, BLOOD (ROUTINE X 2)
CULTURE: NO GROWTH
Culture: NO GROWTH
SPECIAL REQUESTS: ADEQUATE
Special Requests: ADEQUATE

## 2016-12-21 LAB — DRUG PROFILE, UR, 9 DRUGS (LABCORP)
AMPHETAMINES, URINE: NEGATIVE ng/mL
Barbiturate, Ur: NEGATIVE ng/mL
Benzodiazepine Quant, Ur: NEGATIVE ng/mL
COCAINE (METAB.): POSITIVE — AB
Cannabinoid Quant, Ur: POSITIVE — AB
METHADONE SCREEN, URINE: NEGATIVE ng/mL
OPIATE QUANT UR: NEGATIVE ng/mL
PHENCYCLIDINE, UR: NEGATIVE ng/mL
Propoxyphene, Urine: NEGATIVE ng/mL

## 2017-11-21 ENCOUNTER — Other Ambulatory Visit: Payer: Self-pay

## 2017-11-21 ENCOUNTER — Emergency Department (HOSPITAL_COMMUNITY)
Admission: EM | Admit: 2017-11-21 | Discharge: 2017-11-21 | Disposition: A | Discharge status: Home / Self Care | Payer: 59 | Attending: Emergency Medicine | Admitting: Emergency Medicine

## 2017-11-21 ENCOUNTER — Encounter (HOSPITAL_COMMUNITY): Payer: Self-pay

## 2017-11-21 DIAGNOSIS — F17200 Nicotine dependence, unspecified, uncomplicated: Secondary | ICD-10-CM | POA: Insufficient documentation

## 2017-11-21 DIAGNOSIS — L2489 Irritant contact dermatitis due to other agents: Secondary | ICD-10-CM | POA: Insufficient documentation

## 2017-11-21 MED ORDER — HYDROCORTISONE 1 % EX CREA: TOPICAL_CREAM | CUTANEOUS | 0 refills | Status: AC

## 2017-11-21 MED ORDER — HYDROCORTISONE 1 % EX CREA
TOPICAL_CREAM | Freq: Once | CUTANEOUS | Status: AC
  Administered 2017-11-21: 19:00:00 via TOPICAL
  Filled 2017-11-21: qty 28

## 2017-11-21 MED ORDER — CEPHALEXIN 500 MG PO CAPS: 500.0000 mg | ORAL_CAPSULE | Freq: Four times a day (QID) | ORAL | 0 refills | Status: AC

## 2017-11-21 MED ORDER — DEXAMETHASONE 4 MG PO TABS
10.0000 mg | ORAL_TABLET | Freq: Once | ORAL | Status: AC
  Administered 2017-11-21: 10 mg via ORAL
  Filled 2017-11-21: qty 3

## 2017-11-21 NOTE — ED Triage Notes (Signed)
Patient here with bilateral hand swelling and small blister to right wrist x 1 day. States that he pulled insulation yesterday and thinks related, NAD

## 2017-11-21 NOTE — ED Provider Notes (Signed)
[facility] EMERGENCY DEPARTMENT Provider Note   CSN: [id number] Arrival date & time: 11/21/17  7829     History   Chief Complaint Chief Complaint  Patient presents with  . bilateral hand swelling    HPI Jose Turner is a 32 y.o. male.  HPI  Patient is a 32 year old male who comes in today complaining of bilateral hand redness and swelling.  Patient states that he removed a large amount of insulation yesterday and did not wear gloves.  Patient states his symptoms started shortly thereafter.  Patient denies any fevers chills weakness or numbness.   Past Medical History:  Diagnosis Date  . Cellulitis 11/2016   left elbow  . Fracture of elbow 2013    Patient Active Problem List   Diagnosis Date Noted  . Cellulitis 12/14/2016    History reviewed. No pertinent surgical history.      Home Medications    Prior to Admission medications   Medication Sig Start Date End Date Taking? Authorizing Provider  acetaminophen (TYLENOL) 650 MG CR tablet Take 1 tablet (650 mg total) by mouth every 8 (eight) hours as needed for pain. Patient not taking: Reported on 11/21/2017 12/17/16   Richarda Overlie, MD  cephALEXin (KEFLEX) 500 MG capsule Take 1 capsule (500 mg total) by mouth 4 (four) times daily. 11/21/17   Caren Griffins, MD  hydrocortisone cream 1 % Apply to affected area 2 times daily 11/21/17   Caren Griffins, MD    Family History No family history on file.  Social History Social History   Tobacco Use  . Smoking status: Current Every Day Smoker  . Smokeless tobacco: Current User  Substance Use Topics  . Alcohol use: No  . Drug use: Yes    Types: Cocaine, Marijuana     Allergies   Patient has no known allergies.   Review of Systems Review of Systems  Constitutional: Negative for chills, fatigue and fever.  Respiratory: Negative for shortness of breath.   Cardiovascular: Negative for chest pain.  Gastrointestinal: Negative for abdominal pain.    Musculoskeletal: Negative for joint swelling.  Skin: Positive for color change and wound.  All other systems reviewed and are negative.    Physical Exam Updated Vital Signs BP 117/76 (BP Location: Right Arm)   Pulse (!) 51   Temp 98.2 F (36.8 C) (Oral)   Resp 15   SpO2 99%   Physical Exam  Constitutional: He appears well-developed and well-nourished.  HENT:  Head: Normocephalic and atraumatic.  Eyes: Conjunctivae are normal.  Neck: Neck supple.  Cardiovascular: Normal rate and regular rhythm.  No murmur heard. Pulmonary/Chest: Effort normal and breath sounds normal. No respiratory distress.  Abdominal: Soft. There is no tenderness.  Musculoskeletal: He exhibits no edema.  Neurological: He is alert.  Skin: Skin is warm and dry. Rash noted. There is erythema.  Patient with moderate swelling of bilateral hands to the level of the wrist.  Small area of the volar surface of the right wrist with a honey crusted wound with small vesicular eruption adjacent.  Right hand is warm to the touch.  Patient with normal strength and sensory of bilateral upper extremities.  Moderate erythema to bilateral hands  Psychiatric: He has a normal mood and affect.  Nursing note and vitals reviewed.    ED Treatments / Results  Labs (all labs ordered are listed, but only abnormal results are displayed) Labs Reviewed - No data to display  EKG None  Radiology No results found.  Procedures Procedures (including critical care time)  Medications Ordered in ED Medications  dexamethasone (DECADRON) tablet 10 mg (10 mg Oral Given 11/21/17 1752)  hydrocortisone cream 1 % ( Topical Given 11/21/17 1917)     Initial Impression / Assessment and Plan / ED Course  I have reviewed the triage vital signs and the nursing notes.  Pertinent labs & imaging results that were available during my care of the patient were reviewed by me and considered in my medical decision making (see chart for details).      We will give patient 1 application of hydrocortisone cream.  We will additionally give patient a prescription for Keflex and continued hydrocortisone cream application following discharge.  We will additionally give oral dose of Decadron.  Believe patient be suffering from contact dermatitis secondary to irritation.  Physical exam findings concerning for possible concurrent cellulitis. Pt given appropriate f/u and return precautions. Pt voiced understanding and is agreeable to discharge at this time.    Final Clinical Impressions(s) / ED Diagnoses   Final diagnoses:  Irritant contact dermatitis due to other agents    ED Discharge Orders        Ordered    hydrocortisone cream 1 %     11/21/17 1841    cephALEXin (KEFLEX) 500 MG capsule  4 times daily     11/21/17 Jari Favre, MD 11/22/17 0001    Cathren Laine, MD 11/22/17 1539

## 2017-11-21 NOTE — ED Notes (Signed)
Misty would like pt to call her at [phone]

## 2017-12-11 ENCOUNTER — Encounter (HOSPITAL_COMMUNITY): Payer: Self-pay

## 2017-12-11 ENCOUNTER — Emergency Department (HOSPITAL_COMMUNITY)
Admission: EM | Admit: 2017-12-11 | Discharge: 2017-12-11 | Disposition: A | Discharge status: Home / Self Care | Payer: Self-pay | Attending: Emergency Medicine | Admitting: Emergency Medicine

## 2017-12-11 ENCOUNTER — Other Ambulatory Visit: Payer: Self-pay

## 2017-12-11 DIAGNOSIS — L0291 Cutaneous abscess, unspecified: Secondary | ICD-10-CM

## 2017-12-11 DIAGNOSIS — F17210 Nicotine dependence, cigarettes, uncomplicated: Secondary | ICD-10-CM | POA: Insufficient documentation

## 2017-12-11 DIAGNOSIS — Z79899 Other long term (current) drug therapy: Secondary | ICD-10-CM | POA: Insufficient documentation

## 2017-12-11 DIAGNOSIS — L03113 Cellulitis of right upper limb: Secondary | ICD-10-CM | POA: Insufficient documentation

## 2017-12-11 DIAGNOSIS — L02413 Cutaneous abscess of right upper limb: Secondary | ICD-10-CM | POA: Insufficient documentation

## 2017-12-11 LAB — CBC WITH DIFFERENTIAL/PLATELET
Abs Immature Granulocytes: 0.1 10*3/uL (ref 0.0–0.1)
Basophils Absolute: 0 10*3/uL (ref 0.0–0.1)
Basophils Relative: 0 %
Eosinophils Absolute: 0.3 10*3/uL (ref 0.0–0.7)
Eosinophils Relative: 3 %
HCT: 42.9 % (ref 39.0–52.0)
Hemoglobin: 13.6 g/dL (ref 13.0–17.0)
Immature Granulocytes: 1 %
Lymphocytes Relative: 24 %
Lymphs Abs: 2.4 10*3/uL (ref 0.7–4.0)
MCH: 31.2 pg (ref 26.0–34.0)
MCHC: 31.7 g/dL (ref 30.0–36.0)
MCV: 98.4 fL (ref 78.0–100.0)
Monocytes Absolute: 0.5 10*3/uL (ref 0.1–1.0)
Monocytes Relative: 6 %
Neutro Abs: 6.5 10*3/uL (ref 1.7–7.7)
Neutrophils Relative %: 66 %
Platelets: 247 10*3/uL (ref 150–400)
RBC: 4.36 MIL/uL (ref 4.22–5.81)
RDW: 13.1 % (ref 11.5–15.5)
WBC: 9.7 10*3/uL (ref 4.0–10.5)

## 2017-12-11 LAB — BASIC METABOLIC PANEL
Anion gap: 8 (ref 5–15)
BUN: 6 mg/dL (ref 6–20)
CHLORIDE: 104 mmol/L (ref 101–111)
CO2: 28 mmol/L (ref 22–32)
Calcium: 9 mg/dL (ref 8.9–10.3)
Creatinine, Ser: 0.74 mg/dL (ref 0.61–1.24)
GFR calc Af Amer: >60 mL/min (ref >60)
GFR calc non Af Amer: >60 mL/min (ref >60)
GLUCOSE: 114 mg/dL — AB (ref 65–99)
POTASSIUM: 4.1 mmol/L (ref 3.5–5.1)
Sodium: 140 mmol/L (ref 135–145)

## 2017-12-11 MED ORDER — OXYCODONE HCL 5 MG PO TABS
10.0000 mg | ORAL_TABLET | Freq: Once | ORAL | Status: AC
  Administered 2017-12-11: 10 mg via ORAL
  Filled 2017-12-11: qty 2

## 2017-12-11 MED ORDER — LIDOCAINE HCL (PF) 1 % IJ SOLN
5.0000 mL | Freq: Once | INTRAMUSCULAR | Status: AC
  Administered 2017-12-11: 5 mL
  Filled 2017-12-11: qty 5

## 2017-12-11 MED ORDER — SULFAMETHOXAZOLE-TRIMETHOPRIM 800-160 MG PO TABS
1.0000 | ORAL_TABLET | Freq: Once | ORAL | Status: AC
  Administered 2017-12-11: 1 via ORAL
  Filled 2017-12-11: qty 1

## 2017-12-11 MED ORDER — SULFAMETHOXAZOLE-TRIMETHOPRIM 800-160 MG PO TABS: 1.0000 | ORAL_TABLET | Freq: Two times a day (BID) | ORAL | 0 refills | Status: AC

## 2017-12-11 MED ORDER — VANCOMYCIN HCL IN DEXTROSE 1-5 GM/200ML-% IV SOLN: 1000.0000 mg | Freq: Once | INTRAVENOUS | Status: DC

## 2017-12-11 NOTE — ED Provider Notes (Signed)
[facility] EMERGENCY DEPARTMENT Provider Note   CSN: [id number] Arrival date & time: 12/11/17  1927  History   Chief Complaint Chief Complaint  Patient presents with  . Abscess    HPI Jose Turner is a 32 y.o. male.  The history is provided by the patient.  Abscess  Location:  Shoulder/arm Shoulder/arm abscess location:  R upper arm Size:  3x3cm Abscess quality: fluctuance, painful and redness   Red streaking: no   Duration:  5 days Progression:  Worsening Pain details:    Quality:  Dull   Severity:  Moderate   Duration:  5 days   Timing:  Constant   Progression:  Worsening Chronicity:  New Context: injected drug use   Relieved by:  Nothing Worsened by:  Nothing Ineffective treatments:  None tried Associated symptoms: no fatigue, no fever, no nausea and no vomiting   Risk factors: no hx of MRSA and no prior abscess     Past Medical History:  Diagnosis Date  . Cellulitis 11/2016   left elbow  . Fracture of elbow 2013    Patient Active Problem List   Diagnosis Date Noted  . Cellulitis 12/14/2016    History reviewed. No pertinent surgical history.    Home Medications    Prior to Admission medications   Medication Sig Start Date End Date Taking? Authorizing Provider  hydrocortisone cream 1 % Apply to affected area 2 times daily Patient taking differently: Apply 1 application topically 2 (two) times daily as needed for itching.  11/21/17  Yes Caren Griffins, MD  acetaminophen (TYLENOL) 650 MG CR tablet Take 1 tablet (650 mg total) by mouth every 8 (eight) hours as needed for pain. Patient not taking: Reported on 11/21/2017 12/17/16   Richarda Overlie, MD  cephALEXin (KEFLEX) 500 MG capsule Take 1 capsule (500 mg total) by mouth 4 (four) times daily. Patient not taking: Reported on 12/11/2017 11/21/17   Caren Griffins, MD  sulfamethoxazole-trimethoprim (BACTRIM DS,SEPTRA DS) 800-160 MG tablet Take 1 tablet by mouth 2 (two) times daily for 7 days. 12/11/17  12/18/17  Diannia Ruder, MD    Family History No family history on file.  Social History Social History   Tobacco Use  . Smoking status: Current Every Day Smoker  . Smokeless tobacco: Current User  Substance Use Topics  . Alcohol use: No  . Drug use: Yes    Types: Cocaine, Marijuana, IV    Comment: heroin     Allergies   Patient has no known allergies.   Review of Systems Review of Systems  Constitutional: Negative for chills, fatigue and fever.  HENT: Negative for ear pain and sore throat.   Eyes: Negative for pain and visual disturbance.  Respiratory: Negative for cough and shortness of breath.   Cardiovascular: Negative for chest pain and palpitations.  Gastrointestinal: Negative for abdominal pain, nausea and vomiting.  Genitourinary: Negative for dysuria and hematuria.  Musculoskeletal: Positive for myalgias. Negative for arthralgias and back pain.  Skin: Positive for color change and wound. Negative for rash.  Neurological: Negative for seizures and syncope.  All other systems reviewed and are negative.    Physical Exam Updated Vital Signs BP 108/62 (BP Location: Left Arm)   Pulse 75   Temp 98.3 F (36.8 C) (Oral)   Resp 16   SpO2 100%   Physical Exam  Constitutional: He is oriented to person, place, and time. He appears well-developed and well-nourished.  HENT:  Head: Normocephalic and atraumatic.  Mouth/Throat: Oropharynx  is clear and moist.  Eyes: Conjunctivae and EOM are normal.  Neck: Neck supple.  Cardiovascular: Normal rate, regular rhythm and intact distal pulses.  No murmur heard. Pulmonary/Chest: Effort normal and breath sounds normal. No stridor. No respiratory distress.  Musculoskeletal: He exhibits tenderness. He exhibits no edema.  RUE: 3x3 cm violaceous area of fluctuance to right anteromedial upper arm with surrounding erythema and warmth, ttp, NVI distally  Neurological: He is alert and oriented to person, place, and time.  Skin: Skin  is warm and dry. There is erythema.  Psychiatric: He has a normal mood and affect.  Nursing note and vitals reviewed.    ED Treatments / Results  Labs (all labs ordered are listed, but only abnormal results are displayed) Labs Reviewed  BASIC METABOLIC PANEL - Abnormal; Notable for the following components:      Result Value   Glucose, Bld 114 (*)    All other components within normal limits  CULTURE, BLOOD (ROUTINE X 2)  CULTURE, BLOOD (ROUTINE X 2)  CBC WITH DIFFERENTIAL/PLATELET    EKG None  Radiology No results found.  Procedures .Marland KitchenIncision and Drainage Date/Time: 12/12/2017 12:20 AM Performed by: Diannia Ruder, MD Authorized by: Diannia Ruder, MD   Consent:    Consent obtained:  Verbal   Consent given by:  Patient   Risks discussed:  Bleeding, pain, incomplete drainage and infection Location:    Type:  Abscess   Size:  3x3 cm   Location:  Upper extremity   Upper extremity location:  Arm   Arm location:  R upper arm Pre-procedure details:    Skin preparation:  Betadine Anesthesia (see MAR for exact dosages):    Anesthesia method:  Local infiltration   Local anesthetic:  Lidocaine 1% w/o epi Procedure type:    Complexity:  Complex Procedure details:    Needle aspiration: no     Incision types:  Single straight   Incision depth:  Dermal   Scalpel blade:  11   Wound management:  Probed and deloculated, irrigated with saline and extensive cleaning   Drainage:  Bloody and purulent   Drainage amount:  Moderate   Wound treatment:  Wound left open and drain placed   Packing materials:  1/2 in iodoform gauze Post-procedure details:    Patient tolerance of procedure:  Tolerated well, no immediate complications    Medications Ordered in ED Medications  lidocaine (PF) (XYLOCAINE) 1 % injection 5 mL (5 mLs Other Given by Other 12/11/17 2325)  oxyCODONE (Oxy IR/ROXICODONE) immediate release tablet 10 mg (10 mg Oral Given 12/11/17 2218)    sulfamethoxazole-trimethoprim (BACTRIM DS,SEPTRA DS) 800-160 MG per tablet 1 tablet (1 tablet Oral Given 12/11/17 2324)  oxyCODONE (Oxy IR/ROXICODONE) immediate release tablet 10 mg (10 mg Oral Given 12/11/17 2324)     Initial Impression / Assessment and Plan / ED Course  I have reviewed the triage vital signs and the nursing notes.  Pertinent labs & imaging results that were available during my care of the patient were reviewed by me and considered in my medical decision making (see chart for details).     Jose Turner is a 32 y.o. male with PMHx of IVDU who p/w abscess to RUE x 5 days, no systemic complaints. Area next to vein he injected heroin into.  Reviewed and confirmed nursing documentation for past medical history, family history, social history. VS afebrile, wnl. Exam remarkable for 3x3 fluctuance abscess to right upper arm with surrounding erythema c/w cellulitis.   CBC  with no leukocytosis. BMP unremarkable. I&D as above. Cellulitic area outlined. Given PO bactrim, PO oxycodone in department.   Old records reviewed. Labs reviewed by me and used in the medical decision making. D/c home in stable condition with Bactrim, return precautions discussed. Patient agreeable with plan for d/c home.   Final Clinical Impressions(s) / ED Diagnoses   Final diagnoses:  Abscess  Cellulitis of right upper extremity    ED Discharge Orders        Ordered    sulfamethoxazole-trimethoprim (BACTRIM DS,SEPTRA DS) 800-160 MG tablet  2 times daily     12/11/17 2319       Diannia Ruderucker, Shamar Engelmann, MD 12/12/17 [id number]    Linwood DibblesKnapp, Jon, MD 12/14/17 2019

## 2017-12-11 NOTE — ED Triage Notes (Signed)
Pt has an abscess to R upper arm from IV drug use that has been there for one week, denies fevers.

## 2017-12-11 NOTE — ED Provider Notes (Signed)
Patient placed in Quick Look pathway, seen and evaluated   Chief Complaint: Abscess R upper arm  HPI:   IDU Heroin. Abscess r arm, 4-5 days, no fevers. Pain 9/10  ROS: abscess (one)  Physical Exam:   Gen: No distress  Neuro: Awake and Alert  Skin: Warm    Focused Exam: R upper arm with 6cm cm violaceous abscess with surrounding cellulitis.    Initiation of care has begun. The patient has been counseled on the process, plan, and necessity for staying for the completion/evaluation, and the remainder of the medical screening examination     Delos HaringHarris, Shahmeer Bunn, PA-C 12/11/17 2106    Linwood DibblesKnapp, Jon, MD 12/14/17 1815

## 2017-12-17 LAB — CULTURE, BLOOD (ROUTINE X 2)
Culture: NO GROWTH
Culture: NO GROWTH
Special Requests: ADEQUATE

## 2018-11-06 IMAGING — CR DG ELBOW COMPLETE 3+V*L*
4 series · 4 of 4 positions shown · non-contrast
Comparison: 07/05/2013

CLINICAL DATA: Injury 1 week ago pain to the olecranon process

EXAM:
LEFT ELBOW - COMPLETE 3+ VIEW

[elbow ap]
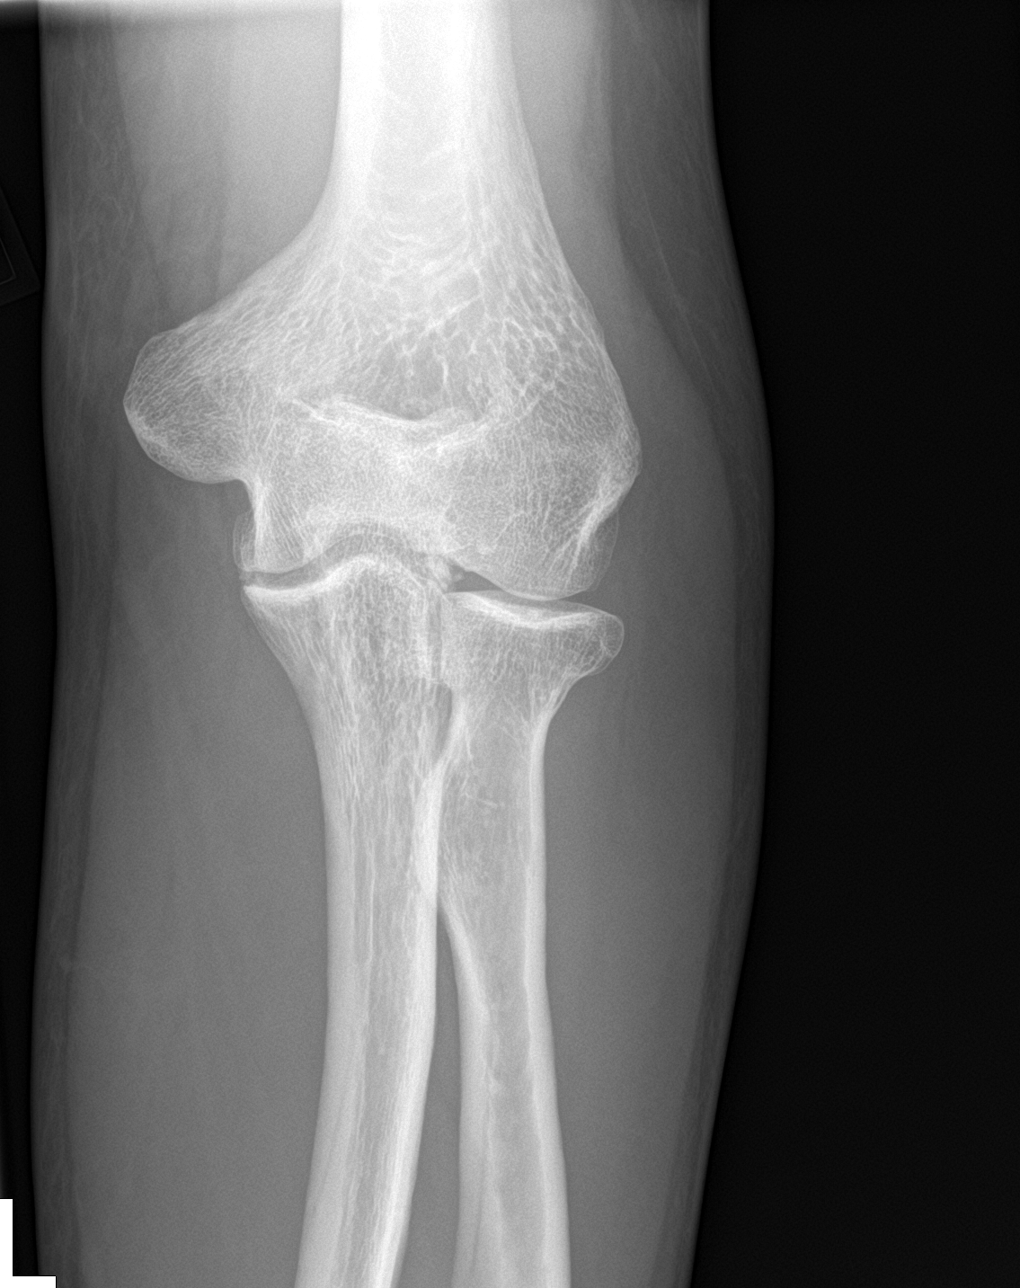

[elbow obl (1 of 2)]
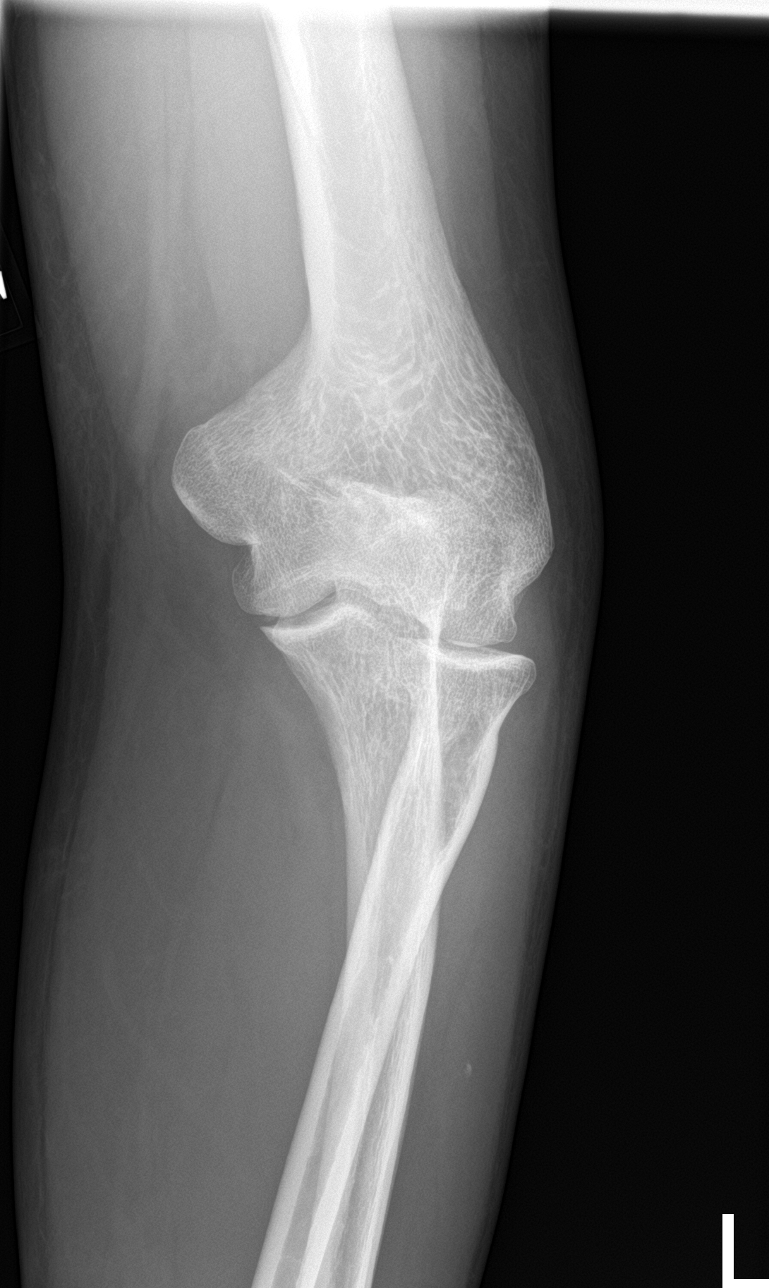

[elbow obl (2 of 2)]
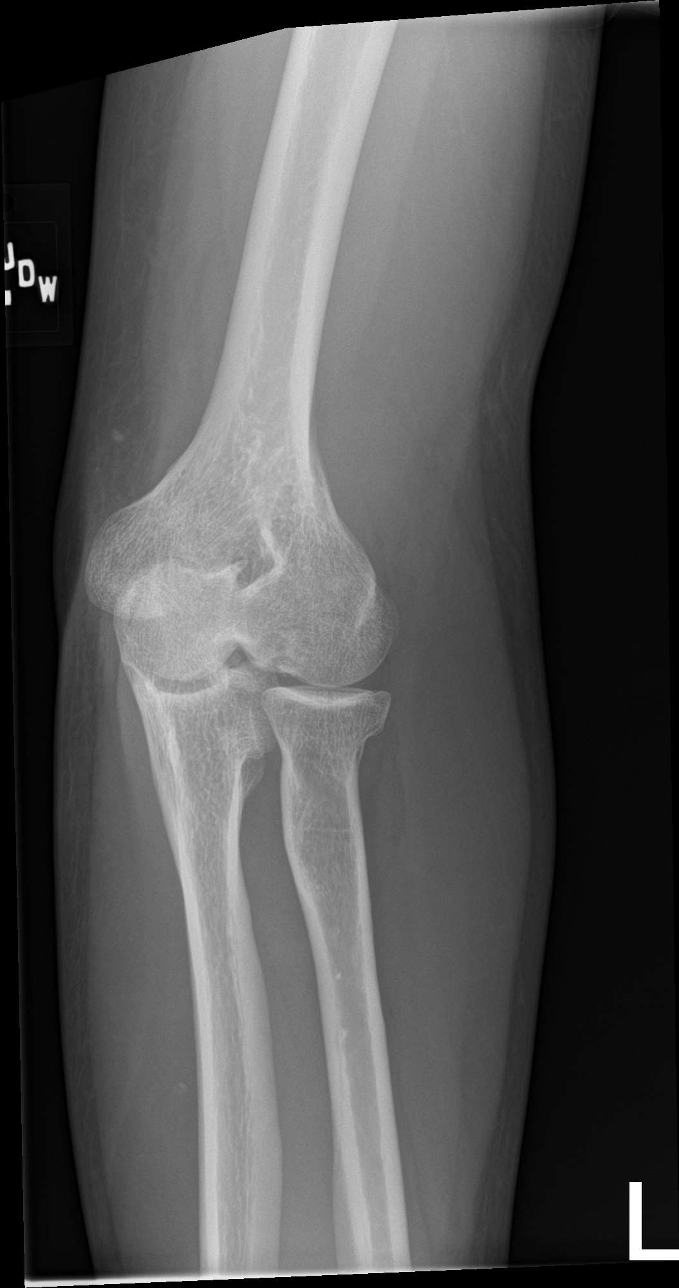

[elbow lat]
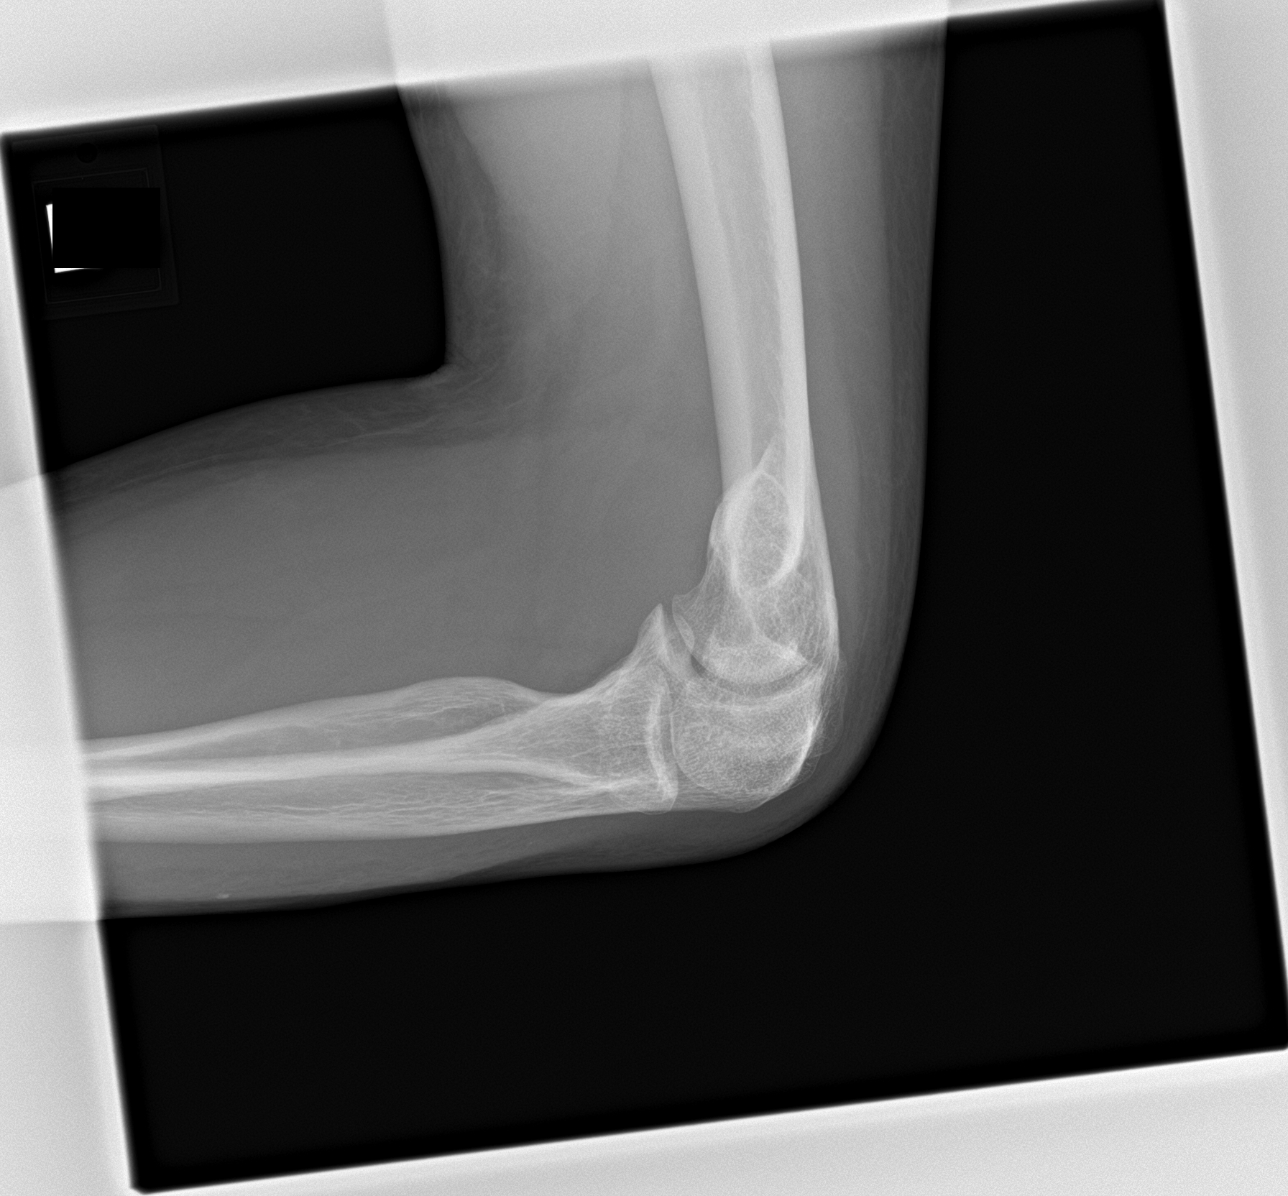

[4 of 4 positions shown; findings below may reference images not displayed]

FINDINGS: No acute fracture or dislocation. No significant elbow effusion.
Mild degenerative osteophytes off the radial head.
IMPRESSION: Mild degenerative changes.  No acute osseous abnormality.

## 2022-12-16 DIAGNOSIS — T887XXA Unspecified adverse effect of drug or medicament, initial encounter: Secondary | ICD-10-CM | POA: Diagnosis not present

## 2022-12-16 DIAGNOSIS — N3001 Acute cystitis with hematuria: Secondary | ICD-10-CM | POA: Diagnosis not present

## 2022-12-16 DIAGNOSIS — X58XXXA Exposure to other specified factors, initial encounter: Secondary | ICD-10-CM | POA: Diagnosis not present

## 2022-12-16 DIAGNOSIS — R404 Transient alteration of awareness: Secondary | ICD-10-CM | POA: Diagnosis not present

## 2022-12-16 DIAGNOSIS — T40411A Poisoning by fentanyl or fentanyl analogs, accidental (unintentional), initial encounter: Secondary | ICD-10-CM | POA: Diagnosis not present

## 2022-12-17 DIAGNOSIS — R319 Hematuria, unspecified: Secondary | ICD-10-CM | POA: Diagnosis not present

## 2022-12-17 DIAGNOSIS — N3001 Acute cystitis with hematuria: Secondary | ICD-10-CM | POA: Diagnosis not present
